# Patient Record
Sex: Female | Born: 1975 | Race: White | Hispanic: No | Marital: Married | Smoking: Former smoker
Health system: Southern US, Community
[De-identification: ages and names within clinical notes are randomized; demographics above are authoritative.]

## PROBLEM LIST (undated history)

## (undated) DIAGNOSIS — E079 Disorder of thyroid, unspecified: Secondary | ICD-10-CM

## (undated) DIAGNOSIS — E119 Type 2 diabetes mellitus without complications: Secondary | ICD-10-CM

## (undated) HISTORY — DX: Disorder of thyroid, unspecified: E07.9

## (undated) HISTORY — DX: Type 2 diabetes mellitus without complications: E11.9

---

## 1996-06-17 HISTORY — PX: TUBAL LIGATION: SHX77

## 2004-07-18 ENCOUNTER — Ambulatory Visit: Payer: Self-pay | Admitting: Family Medicine

## 2004-07-20 ENCOUNTER — Ambulatory Visit: Payer: Self-pay | Admitting: Family Medicine

## 2004-07-24 ENCOUNTER — Ambulatory Visit: Payer: Self-pay | Admitting: Family Medicine

## 2005-01-01 ENCOUNTER — Ambulatory Visit: Payer: Self-pay | Admitting: Family Medicine

## 2005-05-28 ENCOUNTER — Ambulatory Visit: Payer: Self-pay | Admitting: Family Medicine

## 2005-09-19 ENCOUNTER — Ambulatory Visit: Payer: Self-pay | Admitting: Family Medicine

## 2005-10-21 ENCOUNTER — Ambulatory Visit: Payer: Self-pay | Admitting: Family Medicine

## 2005-11-29 ENCOUNTER — Ambulatory Visit: Payer: Self-pay | Admitting: Family Medicine

## 2012-12-15 ENCOUNTER — Telehealth (HOSPITAL_COMMUNITY): Payer: Self-pay | Admitting: Dietician

## 2012-12-15 NOTE — Telephone Encounter (Signed)
Called number provided ([phone]) at [phone] and [phone]. Both attempts yielded a message that number is out of service. Will close out.

## 2012-12-15 NOTE — Telephone Encounter (Signed)
Received voicemail from [place] on 12/14/12 at 1443. She is requesting a call back and nutrition and diabetes management.

## 2017-03-10 ENCOUNTER — Encounter: Payer: Self-pay | Admitting: Family Medicine

## 2017-03-10 ENCOUNTER — Ambulatory Visit (INDEPENDENT_AMBULATORY_CARE_PROVIDER_SITE_OTHER): Payer: Self-pay | Admitting: Family Medicine

## 2017-03-10 VITALS — BP 115/76 | HR 67 | Temp 98.1°F | Wt 138.0 lb

## 2017-03-10 DIAGNOSIS — M62838 Other muscle spasm: Secondary | ICD-10-CM

## 2017-03-10 DIAGNOSIS — Z7689 Persons encountering health services in other specified circumstances: Secondary | ICD-10-CM

## 2017-03-10 DIAGNOSIS — E041 Nontoxic single thyroid nodule: Secondary | ICD-10-CM | POA: Insufficient documentation

## 2017-03-10 DIAGNOSIS — M545 Low back pain, unspecified: Secondary | ICD-10-CM

## 2017-03-10 DIAGNOSIS — E039 Hypothyroidism, unspecified: Secondary | ICD-10-CM | POA: Insufficient documentation

## 2017-03-10 DIAGNOSIS — Z8739 Personal history of other diseases of the musculoskeletal system and connective tissue: Secondary | ICD-10-CM

## 2017-03-10 MED ORDER — IBUPROFEN 800 MG PO TABS: 800.0000 mg | ORAL_TABLET | Freq: Three times a day (TID) | ORAL | 0 refills | Status: DC | PRN

## 2017-03-10 MED ORDER — TIZANIDINE HCL 4 MG PO CAPS: 4.0000 mg | ORAL_CAPSULE | Freq: Three times a day (TID) | ORAL | 0 refills | Status: DC | PRN

## 2017-03-10 NOTE — Assessment & Plan Note (Signed)
Per patient report, she was seen at [facility] in [place]. She had an injection to her back performed at that time. She notes that the pain she is having in her low back is similar to that time. She would like a referral for reevaluation and possible injection. Of note, she does have pain with bilateral straight leg raise.
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
## 2017-03-10 NOTE — Assessment & Plan Note (Signed)
Check TSH today. Additionally, there was an incidental thyroid nodule found on CAT scan at [facility] per patient's report. Unfortunately, I am unable to see this result. However given her known hypothyroidism, will refer to endocrinology for further evaluation and management.

## 2017-03-10 NOTE — Assessment & Plan Note (Signed)
See above. Reasons for emergent evaluation in the emergency department were reviewed with patient. See AVS.

## 2017-03-11 LAB — TSH: TSH: 1.85 u[IU]/mL (ref 0.450–4.500)

## 2017-03-13 ENCOUNTER — Telehealth: Payer: Self-pay | Admitting: Family Medicine
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
## 2017-03-13 NOTE — Telephone Encounter (Signed)
Pt informed to try OTC ibuprofen and Salonpas patches Verbalizes understanding

## 2017-03-27 ENCOUNTER — Ambulatory Visit (INDEPENDENT_AMBULATORY_CARE_PROVIDER_SITE_OTHER): Payer: Self-pay | Admitting: Orthopaedic Surgery

## 2017-04-09 ENCOUNTER — Encounter: Payer: Self-pay | Admitting: "Endocrinology

## 2017-04-09 ENCOUNTER — Ambulatory Visit (INDEPENDENT_AMBULATORY_CARE_PROVIDER_SITE_OTHER): Payer: Self-pay | Admitting: "Endocrinology

## 2017-04-09 VITALS — BP 124/84 | HR 76 | Ht 62.0 in | Wt 141.0 lb

## 2017-04-09 DIAGNOSIS — E041 Nontoxic single thyroid nodule: Secondary | ICD-10-CM

## 2017-04-09 DIAGNOSIS — E039 Hypothyroidism, unspecified: Secondary | ICD-10-CM
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
## 2017-04-10 ENCOUNTER — Other Ambulatory Visit: Payer: Self-pay

## 2017-04-10 ENCOUNTER — Encounter (INDEPENDENT_AMBULATORY_CARE_PROVIDER_SITE_OTHER): Payer: Self-pay | Admitting: Orthopaedic Surgery

## 2017-04-10 ENCOUNTER — Ambulatory Visit (INDEPENDENT_AMBULATORY_CARE_PROVIDER_SITE_OTHER): Payer: Self-pay | Admitting: Orthopaedic Surgery

## 2017-04-10 ENCOUNTER — Ambulatory Visit (INDEPENDENT_AMBULATORY_CARE_PROVIDER_SITE_OTHER): Payer: Self-pay

## 2017-04-10 ENCOUNTER — Other Ambulatory Visit: Payer: Self-pay | Admitting: "Endocrinology

## 2017-04-10 VITALS — BP 134/77 | HR 70 | Ht 63.0 in | Wt 140.0 lb

## 2017-04-10 DIAGNOSIS — M545 Low back pain: Secondary | ICD-10-CM

## 2017-04-10 DIAGNOSIS — E041 Nontoxic single thyroid nodule: Secondary | ICD-10-CM

## 2017-04-10 DIAGNOSIS — E039 Hypothyroidism, unspecified: Secondary | ICD-10-CM
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
## 2017-04-11 LAB — T3, FREE: T3, Free: 2.9 pg/mL (ref 2.0–4.4)

## 2017-04-11 LAB — SPECIMEN STATUS REPORT

## 2017-04-25 ENCOUNTER — Ambulatory Visit (HOSPITAL_COMMUNITY)
Admission: RE | Admit: 2017-04-25 | Discharge: 2017-04-25 | Disposition: A | Discharge status: Home / Self Care | Payer: Self-pay | Source: Ambulatory Visit | Attending: "Endocrinology | Admitting: "Endocrinology

## 2017-04-25 DIAGNOSIS — E041 Nontoxic single thyroid nodule: Secondary | ICD-10-CM | POA: Insufficient documentation

## 2017-04-28 ENCOUNTER — Ambulatory Visit: Payer: Self-pay | Admitting: "Endocrinology

## 2017-04-29 ENCOUNTER — Other Ambulatory Visit: Payer: Self-pay

## 2017-04-30 LAB — T4, FREE: Free T4: 1.31 ng/dL (ref 0.82–1.77)

## 2017-04-30 LAB — THYROGLOBULIN ANTIBODY: Thyroglobulin Antibody: <1 IU/mL (ref 0.0–0.9)

## 2017-04-30 LAB — TSH: TSH: 2.08 u[IU]/mL (ref 0.450–4.500)

## 2017-04-30 LAB — THYROID PEROXIDASE ANTIBODY: Thyroperoxidase Ab SerPl-aCnc: 11 IU/mL (ref 0–34)

## 2017-05-01 ENCOUNTER — Telehealth (INDEPENDENT_AMBULATORY_CARE_PROVIDER_SITE_OTHER): Payer: Self-pay | Admitting: Orthopaedic Surgery

## 2017-05-01 NOTE — Telephone Encounter (Signed)
Cathlean SauerKarylis from the law office of [employer] called stating that the PT facility we referred the patient to does not work on a lien basis and would like to get [facility] [place].  CB# [phone].  Thank you.

## 2017-05-01 NOTE — Telephone Encounter (Signed)
Can you tell me if we have authorization to speak to this law firm? I did not see it in the chart. Thanks.

## 2017-05-01 NOTE — Telephone Encounter (Signed)
Ok thanks 

## 2017-05-01 NOTE — Telephone Encounter (Signed)
Ok for change in location?

## 2017-05-07 ENCOUNTER — Encounter: Payer: Self-pay | Admitting: "Endocrinology

## 2017-05-07 ENCOUNTER — Ambulatory Visit (INDEPENDENT_AMBULATORY_CARE_PROVIDER_SITE_OTHER): Payer: Self-pay | Admitting: "Endocrinology

## 2017-05-07 VITALS — BP 128/86 | HR 83 | Ht 63.0 in | Wt 142.0 lb

## 2017-05-07 DIAGNOSIS — E041 Nontoxic single thyroid nodule: Secondary | ICD-10-CM

## 2017-05-07 DIAGNOSIS — E039 Hypothyroidism, unspecified: Secondary | ICD-10-CM

## 2017-05-07 MED ORDER — LEVOTHYROXINE SODIUM 25 MCG PO TABS
25.0000 ug | ORAL_TABLET | Freq: Every day | ORAL | 6 refills | Status: DC
Start: 2017-05-07 — End: 2017-11-04
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
## 2017-05-13 NOTE — Telephone Encounter (Signed)
I don't have anything

## 2017-05-15 ENCOUNTER — Ambulatory Visit (INDEPENDENT_AMBULATORY_CARE_PROVIDER_SITE_OTHER): Payer: Self-pay | Admitting: Orthopaedic Surgery

## 2017-05-15 ENCOUNTER — Telehealth (INDEPENDENT_AMBULATORY_CARE_PROVIDER_SITE_OTHER): Payer: Self-pay | Admitting: Radiology
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
## 2017-05-15 NOTE — Telephone Encounter (Signed)
I left voicemail. We do not have authorization to speak with law firm.

## 2017-05-15 NOTE — Telephone Encounter (Signed)
She just signs a release at their office and we can send them .

## 2017-05-16 NOTE — Telephone Encounter (Signed)
Patient is going to pick up CD of x-rays done here when she comes to sign release today. Can you please burn lumbar spine x-rays to disc? Thanks.

## 2017-05-16 NOTE — Telephone Encounter (Signed)
I called patient and advised. She is going to come by here and sign medical release for us to send office notes to [facility].

## 2017-06-04 ENCOUNTER — Ambulatory Visit (INDEPENDENT_AMBULATORY_CARE_PROVIDER_SITE_OTHER): Payer: Self-pay | Admitting: *Deleted

## 2017-06-04 DIAGNOSIS — Z23 Encounter for immunization: Secondary | ICD-10-CM

## 2017-10-28 ENCOUNTER — Other Ambulatory Visit: Payer: Self-pay

## 2017-10-28 ENCOUNTER — Other Ambulatory Visit: Payer: Self-pay | Admitting: "Endocrinology

## 2017-10-28 DIAGNOSIS — E1165 Type 2 diabetes mellitus with hyperglycemia: Secondary | ICD-10-CM

## 2017-10-28 DIAGNOSIS — E559 Vitamin D deficiency, unspecified: Secondary | ICD-10-CM

## 2017-10-28 DIAGNOSIS — E039 Hypothyroidism, unspecified: Secondary | ICD-10-CM

## 2017-10-29 LAB — COMPREHENSIVE METABOLIC PANEL
ALT: 19 IU/L (ref 0–32)
AST: 18 IU/L (ref 0–40)
Albumin/Globulin Ratio: 1.9 (ref 1.2–2.2)
Albumin: 4.1 g/dL (ref 3.5–5.5)
Alkaline Phosphatase: 63 IU/L (ref 39–117)
BUN/Creatinine Ratio: 22 (ref 9–23)
BUN: 17 mg/dL (ref 6–24)
Bilirubin Total: 0.3 mg/dL (ref 0.0–1.2)
CO2: 22 mmol/L (ref 20–29)
Calcium: 8.9 mg/dL (ref 8.7–10.2)
Chloride: 102 mmol/L (ref 96–106)
Creatinine, Ser: 0.78 mg/dL (ref 0.57–1.00)
GFR calc Af Amer: 108 mL/min/{1.73_m2} (ref >59)
GFR calc non Af Amer: 93 mL/min/{1.73_m2} (ref >59)
Globulin, Total: 2.2 g/dL (ref 1.5–4.5)
Glucose: 82 mg/dL (ref 65–99)
Potassium: 4.2 mmol/L (ref 3.5–5.2)
Sodium: 138 mmol/L (ref 134–144)
Total Protein: 6.3 g/dL (ref 6.0–8.5)

## 2017-10-29 LAB — TSH: TSH: 2.93 u[IU]/mL (ref 0.450–4.500)

## 2017-10-29 LAB — HEMOGLOBIN A1C
Est. average glucose Bld gHb Est-mCnc: 100 mg/dL
Hgb A1c MFr Bld: 5.1 % (ref 4.8–5.6)

## 2017-10-29 LAB — T4, FREE: Free T4: 1.23 ng/dL (ref 0.82–1.77)

## 2017-10-29 LAB — VITAMIN D 25 HYDROXY (VIT D DEFICIENCY, FRACTURES): Vit D, 25-Hydroxy: 37.2 ng/mL (ref 30.0–100.0)

## 2017-11-04 ENCOUNTER — Encounter: Payer: Self-pay | Admitting: "Endocrinology

## 2017-11-04 ENCOUNTER — Ambulatory Visit (INDEPENDENT_AMBULATORY_CARE_PROVIDER_SITE_OTHER): Payer: Self-pay | Admitting: "Endocrinology

## 2017-11-04 VITALS — BP 135/88 | HR 76 | Ht 63.0 in | Wt 146.0 lb

## 2017-11-04 DIAGNOSIS — E039 Hypothyroidism, unspecified: Secondary | ICD-10-CM

## 2017-11-04 DIAGNOSIS — E041 Nontoxic single thyroid nodule: Secondary | ICD-10-CM

## 2017-11-04 MED ORDER — LEVOTHYROXINE SODIUM 25 MCG PO TABS: 37.5000 ug | ORAL_TABLET | Freq: Every day | ORAL | 6 refills | Status: DC
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
## 2017-11-11 ENCOUNTER — Ambulatory Visit (HOSPITAL_COMMUNITY): Payer: Self-pay

## 2018-04-20 ENCOUNTER — Other Ambulatory Visit: Payer: Self-pay | Admitting: "Endocrinology

## 2018-04-20 DIAGNOSIS — E039 Hypothyroidism, unspecified: Secondary | ICD-10-CM

## 2018-05-04 ENCOUNTER — Ambulatory Visit (HOSPITAL_COMMUNITY)
Admission: RE | Admit: 2018-05-04 | Discharge: 2018-05-04 | Disposition: A | Discharge status: Home / Self Care | Payer: Self-pay | Source: Ambulatory Visit | Attending: "Endocrinology | Admitting: "Endocrinology

## 2018-05-04 ENCOUNTER — Other Ambulatory Visit: Payer: Self-pay

## 2018-05-04 DIAGNOSIS — E041 Nontoxic single thyroid nodule: Secondary | ICD-10-CM | POA: Insufficient documentation

## 2018-05-05 ENCOUNTER — Ambulatory Visit (HOSPITAL_COMMUNITY): Payer: Self-pay

## 2018-05-05 LAB — T4, FREE: Free T4: 1.23 ng/dL (ref 0.82–1.77)

## 2018-05-05 LAB — TSH: TSH: 2.29 u[IU]/mL (ref 0.450–4.500)

## 2018-05-07 ENCOUNTER — Other Ambulatory Visit: Payer: Self-pay | Admitting: "Endocrinology

## 2018-05-11 ENCOUNTER — Encounter: Payer: Self-pay | Admitting: "Endocrinology

## 2018-05-11 ENCOUNTER — Ambulatory Visit (INDEPENDENT_AMBULATORY_CARE_PROVIDER_SITE_OTHER): Payer: Self-pay | Admitting: "Endocrinology

## 2018-05-11 VITALS — BP 118/77 | HR 65 | Ht 63.0 in | Wt 146.0 lb

## 2018-05-11 DIAGNOSIS — E039 Hypothyroidism, unspecified: Secondary | ICD-10-CM

## 2018-05-11 DIAGNOSIS — E041 Nontoxic single thyroid nodule: Secondary | ICD-10-CM

## 2018-05-11 MED ORDER — LEVOTHYROXINE SODIUM 25 MCG PO TABS: 37.5000 ug | ORAL_TABLET | Freq: Every day | ORAL | 2 refills | Status: DC

## 2018-05-11 NOTE — Progress Notes (Signed)
Endocrinology follow-up note   Subjective:    Patient ID: Lori Koch, female    DOB: [birth year], PCP Raliegh Ip, DO   Past Medical History:  Diagnosis Date  . Diabetes mellitus without complication (HCC) 2019   diet only  . Thyroid disease    hypothyroidism   Past Surgical History:  Procedure Laterality Date  . TUBAL LIGATION  1998   Social History   Socioeconomic History  . Marital status: Married    Spouse name: Not on file  . Number of children: Not on file  . Years of education: Not on file  . Highest education level: Not on file  Occupational History  . Not on file  Social Needs  . Financial resource strain: Not on file  . Food insecurity:    Worry: Not on file    Inability: Not on file  . Transportation needs:    Medical: Not on file    Non-medical: Not on file  Tobacco Use  . Smoking status: Former Smoker    Packs/day: 0.50    Years: 10.00    Pack years: 5.00    Types: Cigarettes    Last attempt to quit: 1995    Years since quitting: 24.9  . Smokeless tobacco: Never Used  Substance and Sexual Activity  . Alcohol use: No  . Drug use: No  . Sexual activity: Yes    Birth control/protection: None  Lifestyle  . Physical activity:    Days per week: Not on file    Minutes per session: Not on file  . Stress: Not on file  Relationships  . Social connections:    Talks on phone: Not on file    Gets together: Not on file    Attends religious service: Not on file    Active member of club or organization: Not on file    Attends meetings of clubs or organizations: Not on file    Relationship status: Not on file  Other Topics Concern  . Not on file  Social History Narrative  . Not on file   Outpatient Encounter Medications as of 05/11/2018  Medication Sig  . ibuprofen (ADVIL,MOTRIN) 800 MG tablet Take 1 tablet (800 mg total) by mouth every 8 (eight) hours as needed. (Patient not taking:  Reported on 04/10/2017)  . levothyroxine (SYNTHROID, LEVOTHROID) 25 MCG tablet Take 1.5 tablets (37.5 mcg total) by mouth daily before breakfast.  . [DISCONTINUED] levothyroxine (SYNTHROID, LEVOTHROID) 25 MCG tablet TAKE 1.5 TABLETS (37.5 MCG TOTAL) BY MOUTH DAILY BEFORE BREAKFAST.   No facility-administered encounter medications on file as of 05/11/2018.    ALLERGIES: Allergies  Allergen Reactions  . Sulfa Antibiotics Hives    VACCINATION STATUS: Immunization History  Administered Date(s) Administered  . Influenza,inj,Quad PF,6+ Mos 06/04/2017    HPI Lori Koch is 42 y.o. female who presents today with a medical history as above. she is returning for follow-up of hypothyroidism with new set of thyroid function tests.  She is also known to have nodular goiter.    -She has no new complaints today.   - She says she was diagnosed with hypothyroidism in 2011 when she was initiated on levothyroxine currently on 37.5 g by mouth every morning.   -Her previsit thyroid ultrasound shows overall decrease in size of bilateral thyroid lobes and decrease in size of thyroid nodule in  the right lobe to 1.5 cm from 1.7 cm.  This nodule had no suspicious features.  -She has a steady weight.  Has no new complaints.   She has diet controlled type 2 diabetes with a reported A1c of 4.5%. -She denies palpitations, hot/cold intolerance. She denies dysphagia, shortness of breath, nor voice change. -She denies exposure to neck radiation. - She is a former smoker. - She has 3 grown kids status post tubal ligation in 1998.  Review of Systems  Constitutional: + Steady weight ,  no fatigue, no subjective hyperthermia, no subjective hypothermia Eyes: no blurry vision, no xerophthalmia ENT: no sore throat, no nodules palpated in throat, no dysphagia/odynophagia, no hoarseness Cardiovascular: no chest pain, no palpitations.   Musculoskeletal: no muscle/joint aches Skin: no rashes Neurological: No  tremors, no tingling.  Psychiatric: no depression, no anxiety  Objective:    BP 118/77   Pulse 65   Ht 5\' 3"  (1.6 m)   Wt 146 lb (66.2 kg)   BMI 25.86 kg/m   Wt Readings from Last 3 Encounters:  05/11/18 146 lb (66.2 kg)  11/04/17 146 lb (66.2 kg)  05/07/17 142 lb (64.4 kg)    Physical Exam  Constitutional: not in acute distress, normal state of mind Eyes: PERRLA, EOMI, no exophthalmos ENT: moist mucous membranes, no thyromegaly, no cervical lymphadenopathy  Musculoskeletal: no gross deformities, strength intact in all four extremities Skin: moist, warm, no rashes Neurological: no tremor with outstretched hands, Deep tendon reflexes normal in all four extremities.    TSH from 03/10/2017 was 1.85 Recent Results (from the past 2160 hour(s))  TSH     Status: None   Collection Time: 05/04/18 10:28 AM  Result Value Ref Range   TSH 2.290 0.450 - 4.500 uIU/mL  T4, Free     Status: None   Collection Time: 05/04/18 10:28 AM  Result Value Ref Range   Free T4 1.23 0.82 - 1.77 ng/dL   Thyroid ultrasound from 04/25/2017 showed right lobe 4.6 cm with 1.5 cm nodule; left lobe 3.3 cm with no nodules.   Thyroid ultrasound on May 04, 2018: Right lobe decrease in size to 3.6 cm from four-point recliner, left lobe decreased to 3.1 cm from 3.3 cm.  1.5 cm nodule on the right lobe, previously 1.7 cm.  Assessment & Plan:   1. Hypothyroidism 2. Thyroid nodule - Based on her thyroid function tests, she will benefit from slight increase in her levothyroxine.  -She is advised to continue  levothyroxine to 37.5 mcg p.o. daily before breakfast.   - We discussed about correct intake of levothyroxine, at fasting, with water, separated by at least 30 minutes from breakfast, and separated by more than 4 hours from calcium, iron, multivitamins, acid reflux medications (PPIs). -Patient is made aware of the fact that thyroid hormone replacement is needed for life, dose to be adjusted by periodic  monitoring of thyroid function tests.   - Regarding 1.5 cm nodule on the right lobe with no suspicious features, she will not need immediate intervention. She may need surveillance ultrasound in 1-2 years.     - I advised patient to maintain close follow up with Raliegh IpGottschalk, Ashly M, DO for primary care needs. Follow up plan: Return in about 6 months (around 11/09/2018) for Follow up with Pre-visit Labs.  Marquis LunchGebre Brinden Kincheloe, MD [facility] Phone: [phone]  Fax: [phone]   05/11/2018, 3:34 PM This note was partially dictated with voice recognition software. Similar sounding words  can be transcribed inadequately or may not  be corrected upon review.

## 2018-11-02 ENCOUNTER — Other Ambulatory Visit: Payer: Self-pay | Admitting: "Endocrinology

## 2018-11-02 DIAGNOSIS — E039 Hypothyroidism, unspecified: Secondary | ICD-10-CM

## 2018-11-03 ENCOUNTER — Other Ambulatory Visit: Payer: Self-pay

## 2018-11-04 LAB — TSH: TSH: 2.48 u[IU]/mL (ref 0.450–4.500)

## 2018-11-04 LAB — T4, FREE: Free T4: 1.32 ng/dL (ref 0.82–1.77)

## 2018-11-10 ENCOUNTER — Encounter: Payer: Self-pay | Admitting: "Endocrinology

## 2018-11-10 ENCOUNTER — Ambulatory Visit (INDEPENDENT_AMBULATORY_CARE_PROVIDER_SITE_OTHER): Payer: Self-pay | Admitting: "Endocrinology

## 2018-11-10 ENCOUNTER — Other Ambulatory Visit: Payer: Self-pay

## 2018-11-10 VITALS — BP 113/71 | HR 81 | Ht 63.0 in | Wt 146.0 lb

## 2018-11-10 DIAGNOSIS — E039 Hypothyroidism, unspecified: Secondary | ICD-10-CM
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
## 2018-11-17 ENCOUNTER — Encounter: Payer: Self-pay | Admitting: Family Medicine

## 2018-11-17 ENCOUNTER — Ambulatory Visit (INDEPENDENT_AMBULATORY_CARE_PROVIDER_SITE_OTHER): Payer: Self-pay | Admitting: Family Medicine

## 2018-11-17 ENCOUNTER — Other Ambulatory Visit: Payer: Self-pay

## 2018-11-17 DIAGNOSIS — J0100 Acute maxillary sinusitis, unspecified: Secondary | ICD-10-CM

## 2018-11-17 MED ORDER — PSEUDOEPHEDRINE-GUAIFENESIN ER 60-600 MG PO TB12: 1.0000 | ORAL_TABLET | Freq: Two times a day (BID) | ORAL | 0 refills | Status: DC

## 2018-11-17 MED ORDER — AMOXICILLIN-POT CLAVULANATE 875-125 MG PO TABS: 1.0000 | ORAL_TABLET | Freq: Two times a day (BID) | ORAL | 0 refills | Status: DC

## 2018-11-17 NOTE — Progress Notes (Signed)
Subjective:    Patient ID: Lori ChangLori C Candela, female    DOB: 1976-01-07, 43 y.o.   MRN: [id number]   HPI: Lori Koch is a 43 y.o. female presenting for ears stopped up and constant thick posterior sinus drainage. Using claritin daily. Throat is scratchy. Allergic to pollen of all types. No fever, chills, sweats .    [facility] 05/07/2017 03/10/2017  Decreased Interest 0 0  Down, Depressed, Hopeless 0 0  PHQ - 2 Score 0 0     Relevant past medical, surgical, family and social history reviewed and updated as indicated.  Interim medical history since our last visit reviewed. Allergies and medications reviewed and updated.  ROS:  Review of Systems  Constitutional: Negative for activity change, appetite change, chills and fever.  HENT: Positive for congestion, postnasal drip, rhinorrhea, sinus pressure and sneezing. Negative for ear discharge, hearing loss, nosebleeds and trouble swallowing.   Respiratory: Negative for chest tightness and shortness of breath.   Cardiovascular: Negative for chest pain and palpitations.  Skin: Negative for rash.     Social History   Tobacco Use  Smoking Status Former Smoker  . Packs/day: 0.50  . Years: 10.00  . Pack years: 5.00  . Types: Cigarettes  . Last attempt to quit: 1995  . Years since quitting: 25.4  Smokeless Tobacco Never Used       Objective:     Wt Readings from Last 3 Encounters:  11/10/18 146 lb (66.2 kg)  05/11/18 146 lb (66.2 kg)  11/04/17 146 lb (66.2 kg)     Exam deferred. Pt. Harboring due to COVID 19. Phone visit performed.   Assessment & Plan:   1. Acute maxillary sinusitis, recurrence not specified     Meds ordered this encounter  Medications  . amoxicillin-clavulanate (AUGMENTIN) 875-125 MG tablet    Sig: Take 1 tablet by mouth 2 (two) times daily. Take all of this medication    Dispense:  20 tablet    Refill:  0  . pseudoephedrine-guaifenesin (MUCINEX D) 60-600 MG 12 hr tablet   Sig: Take 1 tablet by mouth every 12 (twelve) hours. As needed for congestion    Dispense:  20 tablet    Refill:  0    No orders of the defined types were placed in this encounter.     Diagnoses and all orders for this visit:  Acute maxillary sinusitis, recurrence not specified  Other orders -     amoxicillin-clavulanate (AUGMENTIN) 875-125 MG tablet; Take 1 tablet by mouth 2 (two) times daily. Take all of this medication -     pseudoephedrine-guaifenesin (MUCINEX D) 60-600 MG 12 hr tablet; Take 1 tablet by mouth every 12 (twelve) hours. As needed for congestion    Virtual Visit via telephone Note  I discussed the limitations, risks, security and privacy concerns of performing an evaluation and management service by telephone and the availability of in person appointments. The patient was identified with two identifiers. Pt.expressed understanding and agreed to proceed. Pt. Is at home. Dr. Darlyn ReadStacks is in his office.  Follow Up Instructions:   I discussed the assessment and treatment plan with the patient. The patient was provided an opportunity to ask questions and all were answered. The patient agreed with the plan and demonstrated an understanding of the instructions.   The patient was advised to call back or seek an in-person evaluation if the symptoms worsen or if the condition fails to improve as anticipated.   Total  minutes including chart review and phone contact time: 10   Follow up plan: Return if symptoms worsen or fail to improve.  Mechele Claude, MD [place] [facility]

## 2019-01-19 ENCOUNTER — Other Ambulatory Visit: Payer: Self-pay

## 2019-01-20 ENCOUNTER — Encounter: Payer: Self-pay | Admitting: Physician Assistant

## 2019-01-20 ENCOUNTER — Ambulatory Visit (INDEPENDENT_AMBULATORY_CARE_PROVIDER_SITE_OTHER): Payer: Self-pay | Admitting: Physician Assistant

## 2019-01-20 VITALS — BP 111/77 | HR 79 | Temp 98.2°F | Ht 63.0 in | Wt 146.6 lb

## 2019-01-20 DIAGNOSIS — Z8781 Personal history of (healed) traumatic fracture: Secondary | ICD-10-CM

## 2019-01-20 DIAGNOSIS — M79672 Pain in left foot: Secondary | ICD-10-CM

## 2019-01-20 NOTE — Patient Instructions (Signed)
Sole inserts REI at [employer]

## 2019-01-21 ENCOUNTER — Other Ambulatory Visit: Payer: Self-pay

## 2019-01-21 ENCOUNTER — Ambulatory Visit (INDEPENDENT_AMBULATORY_CARE_PROVIDER_SITE_OTHER): Payer: Self-pay

## 2019-01-21 DIAGNOSIS — Z8781 Personal history of (healed) traumatic fracture: Secondary | ICD-10-CM

## 2019-01-21 DIAGNOSIS — M79672 Pain in left foot: Secondary | ICD-10-CM

## 2019-01-24 NOTE — Progress Notes (Signed)
BP 111/77   Pulse 79   Temp 98.2 F (36.8 C) (Temporal)   Ht 5\' 3"  (1.6 m)   Wt 146 lb 9.6 oz (66.5 kg)   LMP 01/06/2019   BMI 25.97 kg/m    Subjective:    Patient ID: Lori Koch, female    DOB: July 30, 1975, 43 y.o.   MRN: [id number]  HPI: Lori Koch is a 43 y.o. female presenting on 01/20/2019 for Foot Pain (left)  Over the past couple weeks the patient has a great increase in her pain.  She is working 16-hour shifts.  She did have a stress fracture sometime ago.  And did wear a boot.  She states it feels like it did whenever that was going on.  We will have her come back in for x-ray on a future day.  We did not have that in house today.  We have updated her medications and will plan for her to try to rest it when at all possible.  Past Medical History:  Diagnosis Date  . Diabetes mellitus without complication (HCC) 2019   diet only  . Thyroid disease    hypothyroidism   Relevant past medical, surgical, family and social history reviewed and updated as indicated. Interim medical history since our last visit reviewed. Allergies and medications reviewed and updated. DATA REVIEWED: CHART IN EPIC  Family History reviewed for pertinent findings.  Review of Systems  Constitutional: Negative.   HENT: Negative.   Eyes: Negative.   Respiratory: Negative.   Gastrointestinal: Negative.   Genitourinary: Negative.   Musculoskeletal: Positive for arthralgias, joint swelling and myalgias.    Allergies as of 01/20/2019      Reactions   Sulfa Antibiotics Hives      Medication List       Accurate as of January 20, 2019 11:59 PM. If you have any questions, ask your nurse or doctor.        STOP taking these medications   amoxicillin-clavulanate 875-125 MG tablet Commonly known as: AUGMENTIN Stopped by: Remus LofflerAngel S Danese Dorsainvil, PA-C   pseudoephedrine-guaifenesin 60-600 MG 12 hr tablet Commonly known as: Mucinex D Stopped by: Remus LofflerAngel S Jniya Madara, PA-C     TAKE these medications    ibuprofen 800 MG tablet Commonly known as: ADVIL Take 1 tablet (800 mg total) by mouth every 8 (eight) hours as needed.   levothyroxine 25 MCG tablet Commonly known as: SYNTHROID Take 1.5 tablets (37.5 mcg total) by mouth daily before breakfast.          Objective:    BP 111/77   Pulse 79   Temp 98.2 F (36.8 C) (Temporal)   Ht 5\' 3"  (1.6 m)   Wt 146 lb 9.6 oz (66.5 kg)   LMP 01/06/2019   BMI 25.97 kg/m   Allergies  Allergen Reactions  . Sulfa Antibiotics Hives    Wt Readings from Last 3 Encounters:  01/20/19 146 lb 9.6 oz (66.5 kg)  11/10/18 146 lb (66.2 kg)  05/11/18 146 lb (66.2 kg)    Physical Exam Constitutional:      General: She is not in acute distress.    Appearance: Normal appearance. She is well-developed.  HENT:     Head: Normocephalic and atraumatic.  Cardiovascular:     Rate and Rhythm: Normal rate.  Pulmonary:     Effort: Pulmonary effort is normal.  Musculoskeletal:     Left foot: Tenderness present. No deformity or laceration.       Feet:  Skin:  General: Skin is warm and dry.     Findings: No rash.  Neurological:     Mental Status: She is alert and oriented to person, place, and time.     Deep Tendon Reflexes: Reflexes are normal and symmetric.         Assessment & Plan:   1. Foot pain, left - DG Foot Complete Left; Future  2. History of foot fracture - DG Foot Complete Left; Future   Continue all other maintenance medications as listed above.  Follow up plan: No follow-ups on file.  Educational handout given for [place] PA-C [place] [address]  [place], [place] [address] [phone]   01/24/2019, 10:19 PM

## 2019-03-17 ENCOUNTER — Other Ambulatory Visit: Payer: Self-pay | Admitting: "Endocrinology

## 2019-03-17 DIAGNOSIS — E041 Nontoxic single thyroid nodule: Secondary | ICD-10-CM

## 2019-03-22 ENCOUNTER — Ambulatory Visit (INDEPENDENT_AMBULATORY_CARE_PROVIDER_SITE_OTHER): Payer: Self-pay

## 2019-03-22 ENCOUNTER — Other Ambulatory Visit: Payer: Self-pay

## 2019-03-22 DIAGNOSIS — Z111 Encounter for screening for respiratory tuberculosis: Secondary | ICD-10-CM

## 2019-03-22 NOTE — Progress Notes (Signed)
TB skin test to right arm.  Patient tolerated well.

## 2019-03-24 LAB — TB SKIN TEST
Induration: 0 mm
TB Skin Test: NEGATIVE

## 2019-04-16 ENCOUNTER — Other Ambulatory Visit: Payer: Self-pay

## 2019-04-19 ENCOUNTER — Ambulatory Visit (INDEPENDENT_AMBULATORY_CARE_PROVIDER_SITE_OTHER): Payer: Self-pay | Admitting: *Deleted

## 2019-04-19 ENCOUNTER — Other Ambulatory Visit: Payer: Self-pay

## 2019-04-19 DIAGNOSIS — Z23 Encounter for immunization: Secondary | ICD-10-CM

## 2019-04-26 ENCOUNTER — Other Ambulatory Visit: Payer: Self-pay | Admitting: "Endocrinology

## 2019-04-26 DIAGNOSIS — E039 Hypothyroidism, unspecified: Secondary | ICD-10-CM

## 2019-05-11 ENCOUNTER — Other Ambulatory Visit: Payer: Self-pay

## 2019-05-12 LAB — TSH: TSH: 1.54 u[IU]/mL (ref 0.450–4.500)

## 2019-05-12 LAB — T4, FREE: Free T4: 1.32 ng/dL (ref 0.82–1.77)

## 2019-05-17 ENCOUNTER — Ambulatory Visit: Payer: Self-pay | Admitting: "Endocrinology

## 2019-05-17 ENCOUNTER — Ambulatory Visit (HOSPITAL_COMMUNITY)
Admission: RE | Admit: 2019-05-17 | Discharge: 2019-05-17 | Disposition: A | Discharge status: Home / Self Care | Payer: Self-pay | Source: Ambulatory Visit | Attending: "Endocrinology | Admitting: "Endocrinology

## 2019-05-17 ENCOUNTER — Other Ambulatory Visit: Payer: Self-pay

## 2019-05-17 DIAGNOSIS — E041 Nontoxic single thyroid nodule: Secondary | ICD-10-CM | POA: Insufficient documentation

## 2019-05-20 ENCOUNTER — Other Ambulatory Visit: Payer: Self-pay

## 2019-05-20 ENCOUNTER — Encounter: Payer: Self-pay | Admitting: "Endocrinology

## 2019-05-20 ENCOUNTER — Ambulatory Visit (INDEPENDENT_AMBULATORY_CARE_PROVIDER_SITE_OTHER): Payer: Self-pay | Admitting: "Endocrinology

## 2019-05-20 DIAGNOSIS — E039 Hypothyroidism, unspecified: Secondary | ICD-10-CM

## 2019-05-20 DIAGNOSIS — E041 Nontoxic single thyroid nodule: Secondary | ICD-10-CM

## 2019-05-20 MED ORDER — LEVOTHYROXINE SODIUM 25 MCG PO TABS: 37.5000 ug | ORAL_TABLET | Freq: Every day | ORAL | 1 refills | Status: DC

## 2019-05-20 NOTE — Progress Notes (Signed)
05/20/2019                                Endocrinology Telehealth Visit Follow up Note -During COVID -19 Pandemic  I connected with Lori Koch on 05/20/2019   by telephone and verified that I am speaking with the correct person using two identifiers. Lori Koch, 1976/04/13. she has verbally consented to this visit. All issues noted in this document were discussed and addressed. The format was not optimal for physical exam.   Subjective:    Patient ID: Lori Koch, female    DOB: [birth year], PCP Janora Norlander, DO   Past Medical History:  Diagnosis Date  . Diabetes mellitus without complication ([place]) 5631   diet only  . Thyroid disease    hypothyroidism   Past Surgical History:  Procedure Laterality Date  . TUBAL LIGATION  1998   Social History   Socioeconomic History  . Marital status: Married    Spouse name: Not on file  . Number of children: Not on file  . Years of education: Not on file  . Highest education level: Not on file  Occupational History  . Not on file  Social Needs  . Financial resource strain: Not on file  . Food insecurity    Worry: Not on file    Inability: Not on file  . Transportation needs    Medical: Not on file    Non-medical: Not on file  Tobacco Use  . Smoking status: Former Smoker    Packs/day: 0.50    Years: 10.00    Pack years: 5.00    Types: Cigarettes    Quit date: 1995    Years since quitting: 25.9  . Smokeless tobacco: Never Used  Substance and Sexual Activity  . Alcohol use: No  . Drug use: No  . Sexual activity: Yes    Birth control/protection: None  Lifestyle  . Physical activity    Days per week: Not on file    Minutes per session: Not on file  . Stress: Not on file  Relationships  . Social Herbalist on phone: Not on file    Gets together: Not on file    Attends religious service: Not on file    Active member of club or organization: Not on file    Attends  meetings of clubs or organizations: Not on file    Relationship status: Not on file  Other Topics Concern  . Not on file  Social History Narrative  . Not on file   Outpatient Encounter Medications as of 05/20/2019  Medication Sig  . ibuprofen (ADVIL,MOTRIN) 800 MG tablet Take 1 tablet (800 mg total) by mouth every 8 (eight) hours as needed. (Patient not taking: Reported on 04/10/2017)  . levothyroxine (SYNTHROID) 25 MCG tablet Take 1.5 tablets (37.5 mcg total) by mouth daily before breakfast.  . [DISCONTINUED] levothyroxine (SYNTHROID, LEVOTHROID) 25 MCG tablet Take 1.5 tablets (37.5 mcg total) by mouth daily before breakfast.   No facility-administered encounter medications on file as of 05/20/2019.    ALLERGIES: Allergies  Allergen Reactions  . Sulfa Antibiotics Hives    VACCINATION STATUS: Immunization History  Administered Date(s) Administered  . Influenza Inj Mdck Quad Pf 06/04/2018  . Influenza,inj,Quad PF,6+ Mos 06/04/2017, 04/19/2019  . Influenza-Unspecified 06/04/2018  . PPD Test 03/22/2019    HPI Lori Koch is  43 y.o. female who presents today with a medical history as above. she is being engaged in telehealth via telephone in follow-up for her hypothyroidism.     -She has no new complaints today.   - She says she was diagnosed with hypothyroidism in 2011 when she was initiated on levothyroxine currently on 37.5 g by mouth every morning.   - She is also known to have nodular goiter, previsit thyroid ultrasound is unremarkable.Her  Previous  thyroid ultrasound shows overall decrease in size of bilateral thyroid lobes and decrease in size of thyroid nodule in the right lobe to 1.5 cm from 1.7 cm.  This nodule had no suspicious features.  -She has a steady weight.  Has no new complaints.   She has diet controlled type 2 diabetes with a reported A1c of 4.5%. -She denies palpitations, hot/cold intolerance. She denies dysphagia, shortness of breath, nor voice  change. -She denies exposure to neck radiation. - She is a former smoker. - She has 3 grown kids status post tubal ligation in 1998.  Review of Systems Limited as above.  Objective:    There were no vitals taken for this visit.  Wt Readings from Last 3 Encounters:  01/20/19 146 lb 9.6 oz (66.5 kg)  11/10/18 146 lb (66.2 kg)  05/11/18 146 lb (66.2 kg)    Physical Exam    TSH from 03/10/2017 was 1.85 Recent Results (from the past 2160 hour(s))  PPD     Status: None   Collection Time: 03/24/19  2:04 PM  Result Value Ref Range   TB Skin Test Negative    Induration 0 mm  T4, free     Status: None   Collection Time: 05/11/19 12:00 AM  Result Value Ref Range   Free T4 1.32 0.82 - 1.77 ng/dL  TSH     Status: None   Collection Time: 05/11/19 12:00 AM  Result Value Ref Range   TSH 1.540 0.450 - 4.500 uIU/mL   Thyroid ultrasound from 04/25/2017 showed right lobe 4.6 cm with 1.5 cm nodule; left lobe 3.3 cm with no nodules.   Thyroid ultrasound on May 04, 2018: Right lobe decrease in size to 3.6 cm from four-point recliner, left lobe decreased to 3.1 cm from 3.3 cm.  1.5 cm nodule on the right lobe, previously 1.7 cm.  Thyroid ultrasound on May 17, 2019 -Confirm 2-year stability of 1.6 cm category 3 nodule in the right medial gland.  Recommend continued follow-up every 1-2 years until 5-year stability has been confirmed.  Assessment & Plan:   1. Hypothyroidism 2. Thyroid nodule -Her previsit thyroid function tests are consistent with appropriate replacement.   -She is advised to continue  levothyroxine to 37.5 mcg p.o. daily before breakfast.   - We discussed about the correct intake of her thyroid hormone, on empty stomach at fasting, with water, separated by at least 30 minutes from breakfast and other medications,  and separated by more than 4 hours from calcium, iron, multivitamins, acid reflux medications (PPIs). -Patient is made aware of the fact that thyroid  hormone replacement is needed for life, dose to be adjusted by periodic monitoring of thyroid function tests.  - Regarding 1.5 cm nodule on the right lobe with no suspicious features, she will not need immediate intervention. Her surveillance ultrasound before this visit is unremarkable.  See above.       - I advised patient to maintain close follow up with Raliegh Ip, DO for primary care needs.  Time for this visit: 15 minutes. Billey ChangLori C Delcastillo  participated in the discussions, expressed understanding, and voiced agreement with the above plans.  All questions were answered to her satisfaction. she is encouraged to contact clinic should she have any questions or concerns prior to her return visit.   Follow up plan: Return in about 6 months (around 11/18/2019) for Follow up with Pre-visit Labs.  Marquis LunchGebre Fortino Haag, MD [facility] Phone: [phone]  Fax: [phone]   05/20/2019, 1:53 PM This note was partially dictated with voice recognition software. Similar sounding words can be transcribed inadequately or may not  be corrected upon review.

## 2019-10-04 ENCOUNTER — Ambulatory Visit (INDEPENDENT_AMBULATORY_CARE_PROVIDER_SITE_OTHER): Payer: Self-pay | Admitting: Family Medicine

## 2019-10-04 DIAGNOSIS — M47812 Spondylosis without myelopathy or radiculopathy, cervical region: Secondary | ICD-10-CM

## 2019-10-04 MED ORDER — PREDNISONE 10 MG (21) PO TBPK: ORAL_TABLET | ORAL | 0 refills | Status: DC

## 2019-10-04 NOTE — Patient Instructions (Signed)
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
## 2019-10-07 ENCOUNTER — Ambulatory Visit (INDEPENDENT_AMBULATORY_CARE_PROVIDER_SITE_OTHER): Payer: Self-pay

## 2019-10-07 ENCOUNTER — Ambulatory Visit: Payer: Self-pay | Admitting: Orthopaedic Surgery

## 2019-10-07 ENCOUNTER — Other Ambulatory Visit: Payer: Self-pay

## 2019-10-07 ENCOUNTER — Encounter: Payer: Self-pay | Admitting: Orthopaedic Surgery

## 2019-10-07 VITALS — BP 126/86 | HR 77 | Ht 62.0 in | Wt 149.0 lb

## 2019-10-07 DIAGNOSIS — G8929 Other chronic pain: Secondary | ICD-10-CM

## 2019-10-07 DIAGNOSIS — M542 Cervicalgia: Secondary | ICD-10-CM

## 2019-10-07 DIAGNOSIS — M545 Low back pain, unspecified: Secondary | ICD-10-CM
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
## 2019-11-10 ENCOUNTER — Telehealth: Payer: Self-pay | Admitting: "Endocrinology

## 2019-11-10 DIAGNOSIS — E039 Hypothyroidism, unspecified: Secondary | ICD-10-CM

## 2019-11-10 NOTE — Telephone Encounter (Signed)
Lab orders changed to [facility] 

## 2019-11-10 NOTE — Telephone Encounter (Signed)
Can you change order to [facility] 

## 2019-11-16 ENCOUNTER — Other Ambulatory Visit: Payer: Self-pay

## 2019-11-17 LAB — TSH: TSH: 1.91 u[IU]/mL (ref 0.450–4.500)

## 2019-11-17 LAB — T4, FREE: Free T4: 1.31 ng/dL (ref 0.82–1.77)

## 2019-11-18 ENCOUNTER — Ambulatory Visit (INDEPENDENT_AMBULATORY_CARE_PROVIDER_SITE_OTHER): Payer: Self-pay | Admitting: "Endocrinology

## 2019-11-18 ENCOUNTER — Encounter: Payer: Self-pay | Admitting: "Endocrinology

## 2019-11-18 ENCOUNTER — Other Ambulatory Visit: Payer: Self-pay

## 2019-11-18 VITALS — BP 101/71 | HR 74 | Ht 62.0 in | Wt 149.6 lb

## 2019-11-18 DIAGNOSIS — E039 Hypothyroidism, unspecified: Secondary | ICD-10-CM

## 2019-11-18 DIAGNOSIS — E041 Nontoxic single thyroid nodule: Secondary | ICD-10-CM

## 2019-11-18 MED ORDER — LEVOTHYROXINE SODIUM 25 MCG PO TABS: 37.5000 ug | ORAL_TABLET | Freq: Every day | ORAL | 1 refills | Status: DC

## 2019-11-18 NOTE — Progress Notes (Signed)
11/18/2019        Endocrinology follow-up note    Subjective:    Patient ID: Lori Koch, female    DOB: 1975/09/20, PCP Janora Norlander, DO   Past Medical History:  Diagnosis Date  . Diabetes mellitus without complication ([place]) 4818   diet only  . Thyroid disease    hypothyroidism   Past Surgical History:  Procedure Laterality Date  . TUBAL LIGATION  1998   Social History   Socioeconomic History  . Marital status: Married    Spouse name: Not on file  . Number of children: Not on file  . Years of education: Not on file  . Highest education level: Not on file  Occupational History  . Not on file  Tobacco Use  . Smoking status: Former Smoker    Packs/day: 0.50    Years: 10.00    Pack years: 5.00    Types: Cigarettes    Quit date: 1995    Years since quitting: 26.4  . Smokeless tobacco: Never Used  Substance and Sexual Activity  . Alcohol use: No  . Drug use: No  . Sexual activity: Yes    Birth control/protection: None  Other Topics Concern  . Not on file  Social History Narrative  . Not on file   Social Determinants of Health   Financial Resource Strain:   . Difficulty of Paying Living Expenses:   Food Insecurity:   . Worried About Charity fundraiser in the Last Year:   . Arboriculturist in the Last Year:   Transportation Needs:   . Film/video editor (Medical):   Marland Kitchen Lack of Transportation (Non-Medical):   Physical Activity:   . Days of Exercise per Week:   . Minutes of Exercise per Session:   Stress:   . Feeling of Stress :   Social Connections:   . Frequency of Communication with Friends and Family:   . Frequency of Social Gatherings with Friends and Family:   . Attends Religious Services:   . Active Member of Clubs or Organizations:   . Attends Archivist Meetings:   Marland Kitchen Marital Status:    Outpatient Encounter Medications as of 11/18/2019  Medication Sig  . levothyroxine (SYNTHROID) 25 MCG  tablet Take 1.5 tablets (37.5 mcg total) by mouth daily before breakfast.  . predniSONE (STERAPRED UNI-PAK 21 TAB) 10 MG (21) TBPK tablet As directed x 6 days  . [DISCONTINUED] levothyroxine (SYNTHROID) 25 MCG tablet Take 1.5 tablets (37.5 mcg total) by mouth daily before breakfast.   No facility-administered encounter medications on file as of 11/18/2019.   ALLERGIES: Allergies  Allergen Reactions  . Sulfa Antibiotics Hives    VACCINATION STATUS: Immunization History  Administered Date(s) Administered  . Influenza Inj Mdck Quad Pf 06/04/2018  . Influenza,inj,Quad PF,6+ Mos 06/04/2017, 04/19/2019  . Influenza-Unspecified 06/04/2018  . PPD Test 03/22/2019    HPI Lori Koch is 44 y.o. female who presents today with a medical history as above. she is being seen in follow-up for hypothyroidism, nodular goiter.   -She has no new complaints today.   - She says she was diagnosed with hypothyroidism in 2011 when she was initiated on levothyroxine currently on 37.5 g by mouth every morning.   - She is also known to have nodular goiter, previsit thyroid ultrasound is significant for similar findings compared to prior studies.  Previous  thyroid ultrasound shows overall decrease in size  of bilateral thyroid lobes and decrease in size of thyroid nodule in the right lobe to 1.5 cm from 1.7 cm.  This nodule had no suspicious features.  -She has a steady weight.  Has no new complaints.   She has diet controlled type 2 diabetes with a reported A1c of 4.5%. -She denies palpitations, hot/cold intolerance. She denies dysphagia, shortness of breath, nor voice change. -She denies exposure to neck radiation. - She is a former smoker. - She has 3 grown kids status post tubal ligation in 1998.  Review of Systems Limited as above.  Objective:    BP 101/71   Pulse 74   Ht 5\' 2"  (1.575 m)   Wt 149 lb 9.6 oz (67.9 kg)   BMI 27.36 kg/m   Wt Readings from Last 3 Encounters:  11/18/19 149 lb 9.6  oz (67.9 kg)  10/07/19 149 lb (67.6 kg)  01/20/19 146 lb 9.6 oz (66.5 kg)    Physical Exam    Physical Exam- Limited  Constitutional:  Body mass index is 27.36 kg/m. , not in acute distress, normal state of mind Eyes:  EOMI, no exophthalmos Neck: Supple Thyroid: + Palpable thyroid, no gross goiter.   Respiratory: Adequate breathing efforts Musculoskeletal: no gross deformities, strength intact in all four extremities, no gross restriction of joint movements Skin:  no rashes, no hyperemia Neurological: no tremor with outstretched hands,   TSH from 03/10/2017 was 1.85 Recent Results (from the past 2160 hour(s))  TSH     Status: None   Collection Time: 11/16/19  8:18 AM  Result Value Ref Range   TSH 1.910 0.450 - 4.500 uIU/mL  T4, Free     Status: None   Collection Time: 11/16/19  8:18 AM  Result Value Ref Range   Free T4 1.31 0.82 - 1.77 ng/dL   Thyroid ultrasound from 04/25/2017 showed right lobe 4.6 cm with 1.5 cm nodule; left lobe 3.3 cm with no nodules.   Thyroid ultrasound on May 04, 2018: Right lobe decrease in size to 3.6 cm from four-point recliner, left lobe decreased to 3.1 cm from 3.3 cm.  1.5 cm nodule on the right lobe, previously 1.7 cm.  Thyroid ultrasound on May 17, 2019 -Confirm 2-year stability of 1.6 cm category 3 nodule in the right medial gland.  Recommend continued follow-up every 1-2 years until 5-year stability has been confirmed.  Assessment & Plan:   1. Hypothyroidism 2. Thyroid nodule -Her previsit thyroid function tests are consistent with appropriate replacement.  She is advised to continue  levothyroxine to 37.5 mcg p.o. daily before breakfast.   - We discussed about the correct intake of her thyroid hormone, on empty stomach at fasting, with water, separated by at least 30 minutes from breakfast and other medications,  and separated by more than 4 hours from calcium, iron, multivitamins, acid reflux medications (PPIs). -Patient is  made aware of the fact that thyroid hormone replacement is needed for life, dose to be adjusted by periodic monitoring of thyroid function tests.   - Regarding 1.5 cm nodule on the right lobe with no suspicious features, she will need repeat surveillance ultrasound before her next visit in 1 year.    - I advised patient to maintain close follow up with May 19, 2019, DO for primary care needs.      - Time spent on this patient care encounter:  20 minutes of which 50% was spent in  counseling and the rest reviewing  her current and  previous labs / studies and medications  doses and developing a plan for long term care. [name]  participated in the discussions, expressed understanding, and voiced agreement with the above plans.  All questions were answered to her satisfaction. she is encouraged to contact clinic should she have any questions or concerns prior to her return visit.   Follow up plan: Return in about 1 year (around 11/17/2020) for F/U with Pre-visit Labs, Thyroid / Neck Ultrasound.  Marquis Lunch, MD [facility] Phone: [phone]  Fax: [phone]   11/18/2019, 6:00 PM This note was partially dictated with voice recognition software. Similar sounding words can be transcribed inadequately or may not  be corrected upon review.

## 2019-11-24 ENCOUNTER — Encounter: Payer: Self-pay | Admitting: "Endocrinology

## 2019-12-06 ENCOUNTER — Other Ambulatory Visit: Payer: Self-pay

## 2019-12-06 ENCOUNTER — Ambulatory Visit (INDEPENDENT_AMBULATORY_CARE_PROVIDER_SITE_OTHER): Payer: Self-pay | Admitting: Family

## 2019-12-06 ENCOUNTER — Encounter: Payer: Self-pay | Admitting: Family

## 2019-12-06 VITALS — BP 119/81 | HR 79 | Temp 97.9°F | Ht 62.0 in | Wt 150.8 lb

## 2019-12-06 DIAGNOSIS — F410 Panic disorder [episodic paroxysmal anxiety] without agoraphobia: Secondary | ICD-10-CM

## 2019-12-06 DIAGNOSIS — F411 Generalized anxiety disorder: Secondary | ICD-10-CM

## 2019-12-06 MED ORDER — BUSPIRONE HCL 5 MG PO TABS: 5.0000 mg | ORAL_TABLET | Freq: Three times a day (TID) | ORAL | 1 refills | Status: DC | PRN

## 2019-12-06 MED ORDER — CITALOPRAM HYDROBROMIDE 20 MG PO TABS: 20.0000 mg | ORAL_TABLET | Freq: Every day | ORAL | 1 refills | Status: DC

## 2019-12-06 NOTE — Progress Notes (Signed)
Subjective:    Patient ID: Lori Koch, female    DOB: 1975/11/14, 44 y.o.   MRN: [id number]  Chief Complaint  Patient presents with  . Panic Attack   Pt presents to the office today with increased anxiety and GAD. She states she has had anxiety for years, but over the last month it has become worse. She has a great deal of stress at home. She has a 52 year old daughter with autism, bipolar that she cares for. Her husband's mother is in hospice and having to deal the funeral arrangements. She is also caring for her grandmother who has dementia.  Anxiety Presents for initial visit. Symptoms include depressed mood, excessive worry, irritability, malaise, nervous/anxious behavior, palpitations, panic and restlessness. Patient reports no suicidal ideas.        Review of Systems  Constitutional: Positive for irritability.  Cardiovascular: Positive for palpitations.  Psychiatric/Behavioral: Negative for suicidal ideas. The patient is nervous/anxious.   All other systems reviewed and are negative.      Objective:   Physical Exam Vitals reviewed.  Constitutional:      General: She is not in acute distress.    Appearance: She is well-developed.  HENT:     Head: Normocephalic and atraumatic.     Right Ear: Tympanic membrane normal.     Left Ear: Tympanic membrane normal.  Eyes:     Pupils: Pupils are equal, round, and reactive to light.  Neck:     Thyroid: No thyromegaly.  Cardiovascular:     Rate and Rhythm: Normal rate and regular rhythm.     Heart sounds: Normal heart sounds. No murmur heard.   Pulmonary:     Effort: Pulmonary effort is normal. No respiratory distress.     Breath sounds: Normal breath sounds. No wheezing.  Abdominal:     General: Bowel sounds are normal. There is no distension.     Palpations: Abdomen is soft.     Tenderness: There is no abdominal tenderness.  Musculoskeletal:        General: No tenderness. Normal range of motion.     Cervical  back: Normal range of motion and neck supple.  Skin:    General: Skin is warm and dry.  Neurological:     Mental Status: She is alert and oriented to person, place, and time.     Cranial Nerves: No cranial nerve deficit.     Deep Tendon Reflexes: Reflexes are normal and symmetric.  Psychiatric:        Behavior: Behavior normal.        Thought Content: Thought content normal.        Judgment: Judgment normal.      BP 119/81   Pulse 79   Temp 97.9 F (36.6 C) (Temporal)   Ht 5\' 2"  (1.575 m)   Wt 150 lb 12.8 oz (68.4 kg)   SpO2 98%   BMI 27.58 kg/m       Assessment & Plan:  Lori Koch comes in today with chief complaint of Panic Attack   Diagnosis and orders addressed:  1. GAD (generalized anxiety disorder) - citalopram (CELEXA) 20 MG tablet; Take 1 tablet (20 mg total) by mouth daily.  Dispense: 90 tablet; Refill: 1 - busPIRone (BUSPAR) 5 MG tablet; Take 1 tablet (5 mg total) by mouth 3 (three) times daily as needed.  Dispense: 90 tablet; Refill: 1  2. Panic attack - citalopram (CELEXA) 20 MG tablet; Take 1 tablet (20 mg total) by  mouth daily.  Dispense: 90 tablet; Refill: 1 - busPIRone (BUSPAR) 5 MG tablet; Take 1 tablet (5 mg total) by mouth 3 (three) times daily as needed.  Dispense: 90 tablet; Refill: 1   Will start Celexa 20 mg daily  and Buspar 5 mg  Prn today Stress management discussed RTO in 4 weeks to recheck   Evelina Dun, FNP

## 2019-12-06 NOTE — Patient Instructions (Signed)

## 2019-12-14 ENCOUNTER — Telehealth: Payer: Self-pay | Admitting: *Deleted

## 2019-12-14 NOTE — Telephone Encounter (Signed)
Patient having weakness as if she has had a muscle relaxer. Ate food purchased from [employer] for supper the night before but nothing unusual.Advised her to go to urgent care or hospital if continues or gets worse.

## 2019-12-20 ENCOUNTER — Other Ambulatory Visit: Payer: Self-pay | Admitting: Family

## 2019-12-20 DIAGNOSIS — F410 Panic disorder [episodic paroxysmal anxiety] without agoraphobia: Secondary | ICD-10-CM

## 2019-12-20 DIAGNOSIS — F411 Generalized anxiety disorder: Secondary | ICD-10-CM

## 2019-12-21 NOTE — Telephone Encounter (Signed)
Should we refill for 30 days?

## 2019-12-21 NOTE — Telephone Encounter (Signed)
She should have another refill (just started 11/2019) given 60 days of meds.

## 2019-12-21 NOTE — Telephone Encounter (Signed)
No.  She is being titrated up on this medication.  Please make sure she has a follow up visit scheduled so that we can adjust her medication.

## 2019-12-21 NOTE — Telephone Encounter (Signed)
Next OV 01/12/20

## 2020-01-12 ENCOUNTER — Ambulatory Visit (INDEPENDENT_AMBULATORY_CARE_PROVIDER_SITE_OTHER): Payer: Self-pay | Admitting: Family Medicine

## 2020-01-12 DIAGNOSIS — F439 Reaction to severe stress, unspecified: Secondary | ICD-10-CM

## 2020-01-12 DIAGNOSIS — F411 Generalized anxiety disorder: Secondary | ICD-10-CM

## 2020-01-12 DIAGNOSIS — F418 Other specified anxiety disorders: Secondary | ICD-10-CM

## 2020-01-12 DIAGNOSIS — F410 Panic disorder [episodic paroxysmal anxiety] without agoraphobia: Secondary | ICD-10-CM

## 2020-01-12 MED ORDER — BUSPIRONE HCL 5 MG PO TABS: 5.0000 mg | ORAL_TABLET | Freq: Three times a day (TID) | ORAL | 3 refills | Status: DC | PRN

## 2020-01-12 NOTE — Progress Notes (Signed)
Telephone visit  Subjective: CC: f/u GAD PCP: Raliegh Ip, DO IEP:PIRJ C Labrador is a 44 y.o. female calls for telephone consult today. Patient provides verbal consent for consult held via phone.  Due to COVID-19 pandemic this visit was conducted virtually. This visit type was conducted due to national recommendations for restrictions regarding the COVID-19 Pandemic (e.g. social distancing, sheltering in place) in an effort to limit this patient's exposure and mitigate transmission in our community. All issues noted in this document were discussed and addressed.  A physical exam was not performed with this format.   Location of patient: home Location of provider: [facility] Others present for call: none  1.  Seizure-like anxiety Patient was seen 4 weeks ago for generalized anxiety disorder with exacerbation situationally.  She notes that her husband's parent passed away and he is subsequently been unstable.  Of note he does have schizophrenia and bipolar disorder.  Her daughter also is affected by both blood disorder and has also been an exacerbation since this occurrence.  She is stable on the Celexa 20 mg and BuSpar 5 mg 3 times daily, which was started 4 weeks ago.  She feels these medications are helping.  Does not desire any dose adjustment today.   ROS: Per HPI  Allergies  Allergen Reactions  . Sulfa Antibiotics Hives   Past Medical History:  Diagnosis Date  . Diabetes mellitus without complication ([facility]) 2019   diet only  . Thyroid disease    hypothyroidism    Current Outpatient Medications:  .  busPIRone (BUSPAR) 5 MG tablet, Take 1 tablet (5 mg total) by mouth 3 (three) times daily as needed., Disp: 90 tablet, Rfl: 1 .  citalopram (CELEXA) 20 MG tablet, Take 1 tablet (20 mg total) by mouth daily., Disp: 90 tablet, Rfl: 1 .  levothyroxine (SYNTHROID) 25 MCG tablet, Take 1.5 tablets (37.5 mcg total) by mouth daily before breakfast., Disp: 135 tablet, Rfl: 1  Gen: Normal  speech.  Normal insight.  Pleasant.  Assessment/ Plan: 44 y.o. female   1. Stress at home Still working through stress  2. Situational anxiety  3. GAD (generalized anxiety disorder) Stable with the BuSpar.  I renewed her medication.  She will contact me back prior to her 3-month follow-up if symptoms are worsening - busPIRone (BUSPAR) 5 MG tablet; Take 1 tablet (5 mg total) by mouth 3 (three) times daily as needed.  Dispense: 90 tablet; Refill: 3  4. Panic attack - busPIRone (BUSPAR) 5 MG tablet; Take 1 tablet (5 mg total) by mouth 3 (three) times daily as needed.  Dispense: 90 tablet; Refill: 3   Start time: 11:04am End time: 11:07am  Total time spent on patient care (including telephone call/ virtual visit): 9 minutes  Jarvis Sawa Hulen Skains, DO [facility] [phone]

## 2020-01-30 ENCOUNTER — Other Ambulatory Visit: Payer: Self-pay | Admitting: "Endocrinology

## 2020-03-20 ENCOUNTER — Ambulatory Visit: Payer: Self-pay | Admitting: Family Medicine

## 2020-03-22 ENCOUNTER — Encounter: Payer: Self-pay | Admitting: Family Medicine

## 2020-05-16 ENCOUNTER — Telehealth: Payer: Self-pay

## 2020-05-16 NOTE — Telephone Encounter (Signed)
Pt called to schedule ER follow up for joint pain/foot pain. Was recommended that she see her PCP to get a referral to see a Rheumatologist.   Wants to know if Dr Nadine Counts can work her in to be seen for in person or televisit today if possible.  Please advise and call patient. [phone]

## 2020-05-16 NOTE — Telephone Encounter (Signed)
Spoke with patient, first available appointment with Dr. Nadine Counts is 05/31/20.  Scheduled patient then at 8:30 am.

## 2020-05-17 ENCOUNTER — Ambulatory Visit (INDEPENDENT_AMBULATORY_CARE_PROVIDER_SITE_OTHER): Payer: Self-pay | Admitting: Family Medicine

## 2020-05-17 ENCOUNTER — Encounter: Payer: Self-pay | Admitting: Family Medicine

## 2020-05-17 ENCOUNTER — Other Ambulatory Visit: Payer: Self-pay

## 2020-05-17 VITALS — BP 117/73 | HR 66 | Temp 97.0°F | Ht 62.0 in | Wt 157.0 lb

## 2020-05-17 DIAGNOSIS — M2550 Pain in unspecified joint: Secondary | ICD-10-CM

## 2020-05-17 MED ORDER — PREDNISONE 20 MG PO TABS: ORAL_TABLET | ORAL | 0 refills | Status: DC

## 2020-05-17 NOTE — Progress Notes (Signed)
BP 117/73   Pulse 66   Temp (!) 97 F (36.1 C)   Ht 5\' 2"  (1.575 m)   Wt 157 lb (71.2 kg)   SpO2 100%   BMI 28.72 kg/m    Subjective:   Patient ID: , female    DOB: Sep 10, 1975, 44 y.o.   MRN: [id number]  HPI: Lori Koch is a 44 y.o. female presenting on 05/17/2020 for Generalized Body Aches (c/o joint pain)   HPI Patient has a history of Graves' disease and comes in today complaining of multiple joints hurting sporadically over the past few weeks.  And even more over the past few months. She says it all started with her foot and having pains in her ankle and the ball of her foot a couple months ago and now has extended into her left hip over the past couple days and her right elbow and now into both wrists and fingers over the past few days as well.  She does say she has a family history of of autoimmune diseases and rheumatoid arthritis in her mother.  She says she just keeps getting these aches off and on like this over the past few months at least.  She denies any fevers or chills.  She denies any cough or congestion or illness.  She has been using Tylenol and ibuprofen and they do not seem to be helping.  Relevant past medical, surgical, family and social history reviewed and updated as indicated. Interim medical history since our last visit reviewed. Allergies and medications reviewed and updated.  Review of Systems  Constitutional: Negative for chills and fever.  HENT: Negative for congestion, ear discharge and ear pain.   Eyes: Negative for redness and visual disturbance.  Respiratory: Negative for chest tightness and shortness of breath.   Cardiovascular: Negative for chest pain and leg swelling.  Genitourinary: Negative for difficulty urinating and dysuria.  Musculoskeletal: Positive for arthralgias, gait problem and joint swelling. Negative for back pain.  Skin: Negative for rash.  Neurological: Negative for light-headedness and headaches.   Psychiatric/Behavioral: Negative for agitation and behavioral problems.  All other systems reviewed and are negative.   Per HPI unless specifically indicated above   Allergies as of 05/17/2020      Reactions   Sulfa Antibiotics Hives      Medication List       Accurate as of May 17, 2020 10:00 AM. If you have any questions, ask your nurse or doctor.        busPIRone 5 MG tablet Commonly known as: BUSPAR Take 1 tablet (5 mg total) by mouth 3 (three) times daily as needed.   citalopram 20 MG tablet Commonly known as: CeleXA Take 1 tablet (20 mg total) by mouth daily.   levothyroxine 25 MCG tablet Commonly known as: SYNTHROID TAKE 1.5 TABLETS (37.5 MCG TOTAL) BY MOUTH DAILY BEFORE BREAKFAST.   predniSONE 20 MG tablet Commonly known as: DELTASONE 2 po at same time daily for 5 days Started by: May 19, 2020 Claretha Townshend, MD        Objective:   BP 117/73   Pulse 66   Temp (!) 97 F (36.1 C)   Ht 5\' 2"  (1.575 m)   Wt 157 lb (71.2 kg)   SpO2 100%   BMI 28.72 kg/m   Wt Readings from Last 3 Encounters:  05/17/20 157 lb (71.2 kg)  12/06/19 150 lb 12.8 oz (68.4 kg)  11/18/19 149 lb 9.6 oz (67.9 kg)  Physical Exam Vitals and nursing note reviewed.  Constitutional:      General: She is not in acute distress.    Appearance: She is well-developed. She is not diaphoretic.  Eyes:     Conjunctiva/sclera: Conjunctivae normal.  Musculoskeletal:        General: Normal range of motion.     Right upper arm: Tenderness present. No deformity.     Left wrist: Tenderness present. No swelling or crepitus. Normal range of motion.     Right hand: Tenderness present. Normal range of motion. Normal strength. Normal sensation. Normal capillary refill.     Left hand: Tenderness present. No swelling or deformity. Normal range of motion. Normal strength. Normal sensation. Normal capillary refill.     Left hip: Tenderness present. No deformity, bony tenderness or crepitus. Normal range  of motion.  Skin:    General: Skin is warm and dry.     Findings: No rash.  Neurological:     Mental Status: She is alert and oriented to person, place, and time.     Coordination: Coordination normal.  Psychiatric:        Behavior: Behavior normal.       Assessment & Plan:   Problem List Items Addressed This Visit    None    Visit Diagnoses    Polyarthralgia    -  Primary   Relevant Medications   predniSONE (DELTASONE) 20 MG tablet   Other Relevant Orders   Arthritis Panel   CBC with Differential/Platelet   Sedimentation rate   High sensitivity CRP   ANA,IFA RA Diag Pnl w/rflx Tit/Patn      Will do testing for possible rheumatic illness such as rheumatoid arthritis, if anything comes back positive then will consider going to a rheumatologist.  We will do prednisone course. Follow up plan: Return if symptoms worsen or fail to improve.  Counseling provided for all of the vaccine components Orders Placed This Encounter  Procedures  . Arthritis Panel  . CBC with Differential/Platelet  . Sedimentation rate  . High sensitivity CRP  . ANA,IFA RA Diag Pnl w/rflx Tit/Patn    Arville Care, MD [facility] 05/17/2020, 10:00 AM

## 2020-05-19 LAB — ARTHRITIS PANEL
Basophils Absolute: 0 10*3/uL (ref 0.0–0.2)
Basos: 0 %
EOS (ABSOLUTE): 0 10*3/uL (ref 0.0–0.4)
Eos: 0 %
Hematocrit: 37.8 % (ref 34.0–46.6)
Hemoglobin: 12.9 g/dL (ref 11.1–15.9)
Immature Grans (Abs): 0 10*3/uL (ref 0.0–0.1)
Immature Granulocytes: 0 %
Lymphocytes Absolute: 1.2 10*3/uL (ref 0.7–3.1)
Lymphs: 22 %
MCH: 31.9 pg (ref 26.6–33.0)
MCHC: 34.1 g/dL (ref 31.5–35.7)
MCV: 94 fL (ref 79–97)
Monocytes Absolute: 0.5 10*3/uL (ref 0.1–0.9)
Monocytes: 9 %
Neutrophils Absolute: 3.6 10*3/uL (ref 1.4–7.0)
Neutrophils: 69 %
Platelets: 211 10*3/uL (ref 150–450)
RBC: 4.04 x10E6/uL (ref 3.77–5.28)
RDW: 11.6 % — ABNORMAL LOW (ref 11.7–15.4)
Rheumatoid fact SerPl-aCnc: 19 IU/mL — ABNORMAL HIGH (ref <14.0)
Sed Rate: 2 mm/hr (ref 0–32)
Uric Acid: 4.6 mg/dL (ref 2.6–6.2)
WBC: 5.2 10*3/uL (ref 3.4–10.8)

## 2020-05-19 LAB — ANA,IFA RA DIAG PNL W/RFLX TIT/PATN
ANA Titer 1: POSITIVE — AB
Cyclic Citrullin Peptide Ab: 124 units — ABNORMAL HIGH (ref 0–19)

## 2020-05-19 LAB — HIGH SENSITIVITY CRP: CRP, High Sensitivity: 2.43 mg/L (ref 0.00–3.00)

## 2020-05-19 LAB — FANA STAINING PATTERNS: Speckled Pattern: 1:80 {titer}

## 2020-05-23 ENCOUNTER — Other Ambulatory Visit: Payer: Self-pay | Admitting: Family Medicine

## 2020-05-23 ENCOUNTER — Telehealth: Payer: Self-pay

## 2020-05-23 DIAGNOSIS — M2550 Pain in unspecified joint: Secondary | ICD-10-CM

## 2020-05-23 DIAGNOSIS — M0580 Other rheumatoid arthritis with rheumatoid factor of unspecified site: Secondary | ICD-10-CM

## 2020-05-23 NOTE — Telephone Encounter (Signed)
Patient aware of results and states since her results can back can she get a referral for rheumatology.

## 2020-05-23 NOTE — Telephone Encounter (Signed)
Left message informing pt that Dr. Louanne Skye has referred her to Rheumatology. She should receive a phone call from their office within 2 weeks. If not, she should call our office back.

## 2020-05-23 NOTE — Telephone Encounter (Signed)
I had already placed a referral for rheumatology and I believe I did state that in my result note, thanks

## 2020-05-23 NOTE — Telephone Encounter (Signed)
Please call patient to go over lab results.  

## 2020-05-25 ENCOUNTER — Telehealth: Payer: Self-pay | Admitting: Family Medicine

## 2020-05-31 ENCOUNTER — Encounter: Payer: Self-pay | Admitting: Family Medicine

## 2020-05-31 ENCOUNTER — Ambulatory Visit: Payer: Self-pay | Admitting: Family Medicine

## 2020-06-12 ENCOUNTER — Other Ambulatory Visit: Payer: Self-pay

## 2020-06-12 DIAGNOSIS — Z20822 Contact with and (suspected) exposure to covid-19: Secondary | ICD-10-CM

## 2020-06-13 LAB — SARS-COV-2, NAA 2 DAY TAT

## 2020-06-13 LAB — NOVEL CORONAVIRUS, NAA: SARS-CoV-2, NAA: NOT DETECTED

## 2020-06-29 ENCOUNTER — Telehealth: Payer: Self-pay | Admitting: Family Medicine

## 2020-06-29 NOTE — Telephone Encounter (Signed)
She can take Mucinex,  If her symptoms worsen she needs to be seen.

## 2020-06-29 NOTE — Telephone Encounter (Signed)
Lmtcb.

## 2020-06-29 NOTE — Telephone Encounter (Signed)
Pt was calling to see what she can take for congestion because she has thyroid issues

## 2020-06-29 NOTE — Telephone Encounter (Signed)
Please review and advise- covering PCP

## 2020-06-30 NOTE — Telephone Encounter (Signed)
Returning nurse call.

## 2020-06-30 NOTE — Telephone Encounter (Signed)
Patient aware.

## 2020-07-30 ENCOUNTER — Other Ambulatory Visit: Payer: Self-pay | Admitting: "Endocrinology

## 2020-08-02 ENCOUNTER — Ambulatory Visit (INDEPENDENT_AMBULATORY_CARE_PROVIDER_SITE_OTHER): Payer: Self-pay

## 2020-08-02 ENCOUNTER — Other Ambulatory Visit: Payer: Self-pay

## 2020-08-02 DIAGNOSIS — Z23 Encounter for immunization: Secondary | ICD-10-CM

## 2020-08-02 NOTE — Progress Notes (Signed)
   Covid-19 Vaccination Clinic  Name:  Lori Koch    MRN: [id number] DOB: 20-Aug-1975  08/02/2020  [name] was observed post Covid-19 immunization for 15 minutes without incident. She was provided with Vaccine Information Sheet and instruction to access the V-Safe system.   [name] was instructed to call 911 with any severe reactions post vaccine: Marland Kitchen Difficulty breathing  . Swelling of face and throat  . A fast heartbeat  . A bad rash all over body  . Dizziness and weakness   Immunizations Administered    Name Date Dose VIS Date Route   PFIZER Comrnaty(Gray TOP) Covid-19 Vaccine 08/02/2020  2:46 PM 0.3 mL 05/25/2020 Intramuscular   Manufacturer: [employer], [employer]   Lot: [id number]   NDC: [phone]

## 2020-08-30 ENCOUNTER — Ambulatory Visit: Payer: Self-pay

## 2020-09-18 ENCOUNTER — Other Ambulatory Visit: Payer: Self-pay

## 2020-10-16 ENCOUNTER — Ambulatory Visit (HOSPITAL_COMMUNITY): Payer: Self-pay

## 2020-11-06 ENCOUNTER — Telehealth: Payer: Self-pay

## 2020-11-06 ENCOUNTER — Other Ambulatory Visit: Payer: Self-pay

## 2020-11-06 DIAGNOSIS — E039 Hypothyroidism, unspecified: Secondary | ICD-10-CM

## 2020-11-06 NOTE — Telephone Encounter (Signed)
New order put in/ Ultrasound order sent to schedulers

## 2020-11-06 NOTE — Telephone Encounter (Signed)
Patient needs update lab order and fax to [facility] (they said they can not see the order) also, she needs an ultrasound before her appt 6/6.

## 2020-11-07 ENCOUNTER — Other Ambulatory Visit: Payer: Self-pay

## 2020-11-08 LAB — T4, FREE: Free T4: 1.27 ng/dL (ref 0.82–1.77)

## 2020-11-08 LAB — TSH: TSH: 1.3 u[IU]/mL (ref 0.450–4.500)

## 2020-11-10 ENCOUNTER — Other Ambulatory Visit: Payer: Self-pay

## 2020-11-10 ENCOUNTER — Ambulatory Visit (HOSPITAL_COMMUNITY)
Admission: RE | Admit: 2020-11-10 | Discharge: 2020-11-10 | Disposition: A | Discharge status: Home / Self Care | Payer: Self-pay | Source: Ambulatory Visit | Attending: "Endocrinology | Admitting: "Endocrinology

## 2020-11-10 DIAGNOSIS — E041 Nontoxic single thyroid nodule: Secondary | ICD-10-CM | POA: Insufficient documentation

## 2020-11-20 ENCOUNTER — Encounter: Payer: Self-pay | Admitting: "Endocrinology

## 2020-11-20 ENCOUNTER — Other Ambulatory Visit: Payer: Self-pay

## 2020-11-20 ENCOUNTER — Ambulatory Visit (INDEPENDENT_AMBULATORY_CARE_PROVIDER_SITE_OTHER): Payer: Self-pay | Admitting: "Endocrinology

## 2020-11-20 VITALS — BP 110/78 | HR 56 | Ht 62.0 in | Wt 155.8 lb

## 2020-11-20 DIAGNOSIS — E042 Nontoxic multinodular goiter: Secondary | ICD-10-CM

## 2020-11-20 DIAGNOSIS — E039 Hypothyroidism, unspecified: Secondary | ICD-10-CM

## 2020-11-20 MED ORDER — LEVOTHYROXINE SODIUM 25 MCG PO TABS: 37.5000 ug | ORAL_TABLET | Freq: Every day | ORAL | 1 refills | Status: DC

## 2020-11-20 NOTE — Progress Notes (Signed)
11/20/2020        Endocrinology follow-up note    Subjective:    Patient ID: Lori Koch, female    DOB: 05-Feb-1976, PCP Lori Ip, DO   Past Medical History:  Diagnosis Date  . Diabetes mellitus without complication (HCC) 2019   diet only  . Thyroid disease    hypothyroidism   Past Surgical History:  Procedure Laterality Date  . TUBAL LIGATION  1998   Social History   Socioeconomic History  . Marital status: Married    Spouse name: Not on file  . Number of children: Not on file  . Years of education: Not on file  . Highest education level: Not on file  Occupational History  . Not on file  Tobacco Use  . Smoking status: Former Smoker    Packs/day: 0.50    Years: 10.00    Pack years: 5.00    Types: Cigarettes    Quit date: 1995    Years since quitting: 27.4  . Smokeless tobacco: Never Used  Vaping Use  . Vaping Use: Never used  Substance and Sexual Activity  . Alcohol use: No  . Drug use: No  . Sexual activity: Yes    Birth control/protection: None  Other Topics Concern  . Not on file  Social History Narrative  . Not on file   Social Determinants of Health   Financial Resource Strain: Not on file  Food Insecurity: Not on file  Transportation Needs: Not on file  Physical Activity: Not on file  Stress: Not on file  Social Connections: Not on file   Outpatient Encounter Medications as of 11/20/2020  Medication Sig  . Upadacitinib ER (RINVOQ) 15 MG TB24 Take 1 tablet by mouth daily in the afternoon.  . busPIRone (BUSPAR) 5 MG tablet Take 1 tablet (5 mg total) by mouth 3 (three) times daily as needed.  . citalopram (CELEXA) 20 MG tablet Take 1 tablet (20 mg total) by mouth daily.  Marland Kitchen levothyroxine (SYNTHROID) 25 MCG tablet Take 1.5 tablets (37.5 mcg total) by mouth daily before breakfast.  . predniSONE (DELTASONE) 20 MG tablet 2 po at same time daily for 5 days  . [DISCONTINUED] levothyroxine (SYNTHROID) 25 MCG  tablet TAKE 1.5 TABLETS (37.5 MCG TOTAL) BY MOUTH DAILY BEFORE BREAKFAST.   No facility-administered encounter medications on file as of 11/20/2020.   ALLERGIES: Allergies  Allergen Reactions  . Sulfa Antibiotics Hives    VACCINATION STATUS: Immunization History  Administered Date(s) Administered  . Influenza Inj Mdck Quad Pf 06/04/2018  . Influenza,inj,Quad PF,6+ Mos 06/04/2017, 04/19/2019  . Influenza-Unspecified 06/04/2018  . PFIZER Comirnaty(Gray Top)Covid-19 Tri-Sucrose Vaccine 08/02/2020  . PPD Test 03/22/2019    HPI Lori Koch is 45 y.o. female who presents today with a medical history as above. she is being seen in follow-up for hypothyroidism, multinodular goiter.  -She is on stable dose of levothyroxine 37.5 mcg p.o. daily before breakfast.  She continues to tolerate this medication.  -She has no new complaints today.   - She says she was diagnosed with hypothyroidism in 2011.  - She is also known to have nodular goiter, previsit thyroid ultrasound is significant for similar findings compared to prior studies.  Previous  thyroid ultrasound shows overall decrease in size of bilateral thyroid lobes and decrease in size of thyroid nodule in the right lobe to 1.5 cm from 1.7 cm.  This nodule had no suspicious features.  Her previsit  thyroid ultrasound documents similar findings. -In the interim, she was diagnosed with rheumatoid arthritis which required brief intervention with steroids which caused unintended weight gain of 5 pounds.      She has diet controlled type 2 diabetes with a reported A1c of 4.5%. -She denies palpitations, hot/cold intolerance. She denies dysphagia, shortness of breath, nor voice change. -She denies exposure to neck radiation. - She is a former smoker. - She has 3 grown kids status post tubal ligation in 1998.  Review of Systems Limited as above.  Objective:    BP 110/78   Pulse (!) 56   Ht 5\' 2"  (1.575 m)   Wt 155 lb 12.8 oz (70.7 kg)    BMI 28.50 kg/m   Wt Readings from Last 3 Encounters:  11/20/20 155 lb 12.8 oz (70.7 kg)  05/17/20 157 lb (71.2 kg)  12/06/19 150 lb 12.8 oz (68.4 kg)    Physical Exam    Physical Exam- Limited  Constitutional:  Body mass index is 28.5 kg/m. , not in acute distress, normal state of mind   TSH from 03/10/2017 was 1.85 Recent Results (from the past 2160 hour(s))  T4, Free     Status: None   Collection Time: 11/07/20  1:08 PM  Result Value Ref Range   Free T4 1.27 0.82 - 1.77 ng/dL  TSH     Status: None   Collection Time: 11/07/20  1:08 PM  Result Value Ref Range   TSH 1.300 0.450 - 4.500 uIU/mL   Thyroid ultrasound from 04/25/2017 showed right lobe 4.6 cm with 1.5 cm nodule; left lobe 3.3 cm with no nodules.   Thyroid ultrasound on May 04, 2018: Right lobe decrease in size to 3.6 cm from four-point recliner, left lobe decreased to 3.1 cm from 3.3 cm.  1.5 cm nodule on the right lobe, previously 1.7 cm.  Thyroid ultrasound on May 17, 2019 -Confirm 2-year stability of 1.6 cm category 3 nodule in the right medial gland.  Recommend continued follow-up every 1-2 years until 5-year stability has been confirmed.   Thyroid ultrasound on Nov 10, 2020:  IMPRESSION: 1. Nodule 1 located in the mid right thyroid lobe is not significantly changed in size since 04/25/2017. Follow-up ultrasound should be performed to document 5 years of stability. 2. Nodule 3 located in the left inferior thyroid lobe is not significantly changed in size since 05/17/2019. This meets criteria for surveillance. Follow-up thyroid ultrasound should be performed in 1 year.  Assessment & Plan:   1. Hypothyroidism 2. Thyroid nodule -Her previsit thyroid function tests are consistent with appropriate replacement.  She is advised to continue levothyroxine 37.5 mcg p.o. daily before breakfast.   - We discussed about the correct intake of her thyroid hormone, on empty stomach at fasting, with water,  separated by at least 30 minutes from breakfast and other medications,  and separated by more than 4 hours from calcium, iron, multivitamins, acid reflux medications (PPIs). -Patient is made aware of the fact that thyroid hormone replacement is needed for life, dose to be adjusted by periodic monitoring of thyroid function tests.  - Regarding 1.5 cm nodule on the right lobe and 0.9 cm nodule in the left lobe with no suspicious features, she will not need any intervention at this time.  She will be considered for 1 more surveillance ultrasound in 1 to 2 years.    - I advised patient to maintain close follow up with 05/19/2019, DO for primary care needs.  I spent 25 minutes in the care of the patient today including review of labs from Thyroid Function, CMP, and other relevant labs ; imaging/biopsy records (current and previous including abstractions from other facilities); face-to-face time discussing  her lab results and symptoms, medications doses, her options of short and long term treatment based on the latest standards of care / guidelines;   and documenting the encounter.  Lori Koch  participated in the discussions, expressed understanding, and voiced agreement with the above plans.  All questions were answered to her satisfaction. she is encouraged to contact clinic should she have any questions or concerns prior to her return visit.    Follow up plan: Return in about 6 months (around 05/22/2021) for F/U with Pre-visit Labs.  Marquis Lunch, MD [facility] Phone: [phone]  Fax: [phone]   11/20/2020, 3:05 PM This note was partially dictated with voice recognition software. Similar sounding words can be transcribed inadequately or may not  be corrected upon review.

## 2021-01-04 ENCOUNTER — Ambulatory Visit (INDEPENDENT_AMBULATORY_CARE_PROVIDER_SITE_OTHER): Payer: Self-pay | Admitting: Family Medicine

## 2021-01-04 ENCOUNTER — Other Ambulatory Visit: Payer: Self-pay

## 2021-01-04 ENCOUNTER — Encounter: Payer: Self-pay | Admitting: Family Medicine

## 2021-01-04 VITALS — BP 104/69 | HR 59 | Temp 99.3°F | Ht 62.0 in | Wt 150.6 lb

## 2021-01-04 DIAGNOSIS — N911 Secondary amenorrhea: Secondary | ICD-10-CM

## 2021-01-04 DIAGNOSIS — R350 Frequency of micturition: Secondary | ICD-10-CM

## 2021-01-04 LAB — MICROSCOPIC EXAMINATION
Bacteria, UA: NONE SEEN
Epithelial Cells (non renal): NONE SEEN /hpf (ref 0–10)
RBC, Urine: NONE SEEN /hpf (ref 0–2)
WBC, UA: NONE SEEN /hpf (ref 0–5)

## 2021-01-04 LAB — URINALYSIS, COMPLETE
Bilirubin, UA: NEGATIVE
Glucose, UA: NEGATIVE
Ketones, UA: NEGATIVE
Leukocytes,UA: NEGATIVE
Nitrite, UA: NEGATIVE
Protein,UA: NEGATIVE
RBC, UA: NEGATIVE
Specific Gravity, UA: <1.005 — ABNORMAL LOW (ref 1.005–1.030)
Urobilinogen, Ur: 0.2 mg/dL (ref 0.2–1.0)
pH, UA: 5.5 (ref 5.0–7.5)

## 2021-01-04 MED ORDER — MEDROXYPROGESTERONE ACETATE 5 MG PO TABS: 10.0000 mg | ORAL_TABLET | Freq: Every day | ORAL | 0 refills | Status: DC

## 2021-01-04 NOTE — Patient Instructions (Signed)
Secondary Amenorrhea  Secondary amenorrhea occurs when a female who was previously having menstrual periods has not had them for 3-6 months. A menstrual period is the monthly shedding of the lining of the uterus. The lining of the uterus is made up of blood, tissue, fluid, and mucus. The flow of blood usually occurs during 3-7consecutive days each month. This condition has many causes. In many cases, treating the underlying causewill return menstrual periods back to a normal cycle. What are the causes? The most common cause of this condition is pregnancy. Other medical conditions that can cause secondary amenorrhea include: Cirrhosis of the liver. Conditions of the blood. Diabetes. Epilepsy. Chronic kidney disease. Polycystic ovary disease. A hormonal imbalance. Ovarian failure. Cystic fibrosis. Early menopause. Cushing syndrome. Thyroid problems. Other causes may include: Malnutrition. Stress or anxiety. Medicines. Extreme obesity. Low body weight or drastic weight loss. Removal of the ovaries or uterus. Contraceptive pills, patches, or vaginal rings. What increases the risk? You are more likely to develop this condition if: You have a family history of this condition. You have an eating disorder. You do extreme athletic training. You have a chronic disease. You abuse substances such as alcohol or cigarettes. What are the signs or symptoms? The main symptom of this condition is a lack of menstrual periods for 3-6months in a female who previously had menstrual periods. How is this diagnosed? This condition may be diagnosed based on: Your medical history. A physical exam. A pelvic exam to check for problems with your reproductive organs. A procedure to examine the uterus. A measurement of your body mass index (BMI). You may also have other tests, including: Blood tests that measure certain hormones in your body and rule out pregnancy. Urine tests. Imaging tests, such as an  ultrasound, CT scan, or MRI. How is this treated? Treatment for this condition depends on the cause of the amenorrhea. It may involve: Correcting diet-related problems. Treating underlying conditions. Medicines. Lifestyle changes. Surgery. If the condition cannot be corrected, it is sometimes possible to startmenstrual periods with medicines. Follow these instructions at home: Lifestyle     Maintain a healthy diet. In general, a healthy diet includes lots of fruits and vegetables, low-fat dairy products, lean meats, and foods that contain fiber. Ask to meet with a registered dietitian for nutrition counseling and meal planning. Maintain a healthy weight. Talk to your health care provider before trying any new diet or exercise plan. Exercise at least 30 minutes 5 or more days each week. Exercising includes brisk walking, yard work, biking, running, swimming, and team sports like basketball and soccer. Ask your health care provider which exercises are safe for you. Get enough sleep. Plan your sleep time to allow for 7-9 hours of sleep each night. Learn to manage stress. Explore relaxation techniques such as meditation, journaling, yoga, or tai chi. General instructions Be aware of changes in your menstrual cycle. Keep a record of when you have your menstrual period. Note the date your period starts, how long it lasts, and any problems you experience. Take over-the-counter and prescription medicines only as told by your health care provider. Keep all follow-up visits. This is important. Contact a health care provider if: Your periods do not return to normal after treatment. Summary Secondary amenorrhea is when a female who was previously having menstrual periods has not gotten her period for 3-6 months. This condition has many causes. In many cases, treating the underlying cause will return menstrual periods back to a normal cycle. Talk to   your health care provider if your periods do not  return to normal after treatment. This information is not intended to replace advice given to you by your health care provider. Make sure you discuss any questions you have with your healthcare provider. Document Revised: 01/19/2020 Document Reviewed: 01/19/2020 Elsevier Patient Education  2022 Elsevier Inc.   

## 2021-01-04 NOTE — Progress Notes (Signed)
Acute Office Visit  Subjective:    Patient ID: Lori Koch, female    DOB: 1975/07/31, 45 y.o.   MRN: [id number]  Chief Complaint  Patient presents with   Abdominal Pain   Back Pain   Amenorrhea    HPI Patient is in today for amenorrhea.  She reports having a normal period on 11/29/20. She reports that this month she had spotting for 1 day on 12/29/20. Then no bleeding. She normally has heavy bleeding with her cycles. Cycles are normally regular. Since 12/29/20 she has felt tired. She has also had cramping in her lower back and lower abdomen. Also reports bloating and urinary frequency. Denies vaginal discharge or itching. Denies fever, chills, vomiting, diarrhea, urgency, or dysuria. She had had her tubes tied. She had a negative pregnancy test. Her thyroid level was checked in June and was normal.   Past Medical History:  Diagnosis Date   Diabetes mellitus without complication (HCC) 2019   diet only   Thyroid disease    hypothyroidism    Past Surgical History:  Procedure Laterality Date   TUBAL LIGATION  1998    Family History  Problem Relation Age of Onset   Cancer Mother 44       breast   Hypothyroidism Mother    Heart murmur Mother    Heart murmur Daughter    Bipolar disorder Daughter    Autism Daughter    Allergic Disorder Daughter    Asthma Daughter    Dementia Maternal Grandmother    Atrial fibrillation Maternal Grandmother    Thyroid disease Maternal Grandmother    Heart disease Maternal Grandfather    Heart attack Maternal Grandfather    Allergic Disorder Daughter     Social History   Socioeconomic History   Marital status: Married    Spouse name: Not on file   Number of children: Not on file   Years of education: Not on file   Highest education level: Not on file  Occupational History   Not on file  Tobacco Use   Smoking status: Former    Packs/day: 0.50    Years: 10.00    Pack years: 5.00    Types: Cigarettes    Quit date: 1995     Years since quitting: 27.5   Smokeless tobacco: Never  Vaping Use   Vaping Use: Never used  Substance and Sexual Activity   Alcohol use: No   Drug use: No   Sexual activity: Yes    Birth control/protection: None  Other Topics Concern   Not on file  Social History Narrative   Not on file   Social Determinants of Health   Financial Resource Strain: Not on file  Food Insecurity: Not on file  Transportation Needs: Not on file  Physical Activity: Not on file  Stress: Not on file  Social Connections: Not on file  Intimate Partner Violence: Not on file    Outpatient Medications Prior to Visit  Medication Sig Dispense Refill   busPIRone (BUSPAR) 5 MG tablet Take 1 tablet (5 mg total) by mouth 3 (three) times daily as needed. 90 tablet 3   citalopram (CELEXA) 20 MG tablet Take 1 tablet (20 mg total) by mouth daily. 90 tablet 1   levothyroxine (SYNTHROID) 25 MCG tablet Take 1.5 tablets (37.5 mcg total) by mouth daily before breakfast. 135 tablet 1   predniSONE (DELTASONE) 5 MG tablet Take 5 mg by mouth daily with breakfast.     Upadacitinib ER (RINVOQ)  15 MG TB24 Take 1 tablet by mouth daily in the afternoon.     predniSONE (DELTASONE) 20 MG tablet 2 po at same time daily for 5 days (Patient not taking: Reported on 01/04/2021) 10 tablet 0   No facility-administered medications prior to visit.    Allergies  Allergen Reactions   Sulfa Antibiotics Hives    Review of Systems As per HPI.    Objective:    Physical Exam Vitals and nursing note reviewed.  Constitutional:      General: She is not in acute distress.    Appearance: She is not ill-appearing, toxic-appearing or diaphoretic.  Pulmonary:     Effort: Pulmonary effort is normal. No respiratory distress.     Breath sounds: Normal breath sounds.  Abdominal:     General: Bowel sounds are normal. There is no distension.     Palpations: Abdomen is soft.     Tenderness: There is abdominal tenderness in the right lower  quadrant, suprapubic area and left lower quadrant. There is no right CVA tenderness, left CVA tenderness, guarding or rebound.  Skin:    General: Skin is warm and dry.  Neurological:     General: No focal deficit present.     Mental Status: She is alert and oriented to person, place, and time.  Psychiatric:        Mood and Affect: Mood normal.        Behavior: Behavior normal.    BP 104/69   Pulse (!) 59   Temp 99.3 F (37.4 C) (Temporal)   Ht 5\' 2"  (1.575 m)   Wt 150 lb 9.6 oz (68.3 kg)   LMP 11/29/2020   BMI 27.55 kg/m  Wt Readings from Last 3 Encounters:  01/04/21 150 lb 9.6 oz (68.3 kg)  11/20/20 155 lb 12.8 oz (70.7 kg)  05/17/20 157 lb (71.2 kg)    Health Maintenance Due  Topic Date Due   HIV Screening  Never done   Hepatitis C Screening  Never done   TETANUS/TDAP  Never done   PAP SMEAR-Modifier  Never done   COLONOSCOPY (Pts 45-39yrs Insurance coverage will need to be confirmed)  Never done   COVID-19 Vaccine (2 - Pfizer series) 08/23/2020    There are no preventive care reminders to display for this patient.   Lab Results  Component Value Date   TSH 1.300 11/07/2020   Lab Results  Component Value Date   WBC 5.2 05/17/2020   HGB 12.9 05/17/2020   HCT 37.8 05/17/2020   MCV 94 05/17/2020   PLT 211 05/17/2020   Lab Results  Component Value Date   NA 138 10/28/2017   K 4.2 10/28/2017   CO2 22 10/28/2017   GLUCOSE 82 10/28/2017   BUN 17 10/28/2017   CREATININE 0.78 10/28/2017   BILITOT 0.3 10/28/2017   ALKPHOS 63 10/28/2017   AST 18 10/28/2017   ALT 19 10/28/2017   PROT 6.3 10/28/2017   ALBUMIN 4.1 10/28/2017   CALCIUM 8.9 10/28/2017   No results found for: CHOL No results found for: HDL No results found for: LDLCALC No results found for: TRIG No results found for: CHOLHDL Lab Results  Component Value Date   HGBA1C 5.1 10/28/2017       Assessment & Plan:   Lori Koch was seen today for abdominal pain, back pain and amenorrhea.  Diagnoses  and all orders for this visit:  Frequent urination UA negative.  -     Urinalysis, Complete  Secondary amenorrhea  Negative home pregnancy test, hx of tubal ligation. Provera ordered as below.  -     medroxyPROGESTERone (PROVERA) 5 MG tablet; Take 2 tablets (10 mg total) by mouth daily for 5 days.  Return to office for new or worsening symptoms, or if symptoms persist.   The patient indicates understanding of these issues and agrees with the plan.  Gabriel Earing, FNP

## 2021-01-31 ENCOUNTER — Other Ambulatory Visit: Payer: Self-pay | Admitting: "Endocrinology

## 2021-02-28 ENCOUNTER — Ambulatory Visit: Payer: Self-pay | Admitting: Family Medicine

## 2021-02-28 ENCOUNTER — Other Ambulatory Visit: Payer: Self-pay

## 2021-02-28 ENCOUNTER — Other Ambulatory Visit (HOSPITAL_COMMUNITY)
Admission: RE | Admit: 2021-02-28 | Discharge: 2021-02-28 | Disposition: A | Discharge status: Home / Self Care | Payer: Self-pay | Source: Ambulatory Visit | Attending: Family Medicine | Admitting: Family Medicine

## 2021-02-28 ENCOUNTER — Encounter: Payer: Self-pay | Admitting: Family Medicine

## 2021-02-28 VITALS — BP 113/72 | HR 80 | Temp 98.4°F | Ht 62.0 in | Wt 143.6 lb

## 2021-02-28 DIAGNOSIS — F411 Generalized anxiety disorder: Secondary | ICD-10-CM

## 2021-02-28 DIAGNOSIS — Z124 Encounter for screening for malignant neoplasm of cervix: Secondary | ICD-10-CM

## 2021-02-28 DIAGNOSIS — F410 Panic disorder [episodic paroxysmal anxiety] without agoraphobia: Secondary | ICD-10-CM

## 2021-02-28 DIAGNOSIS — N926 Irregular menstruation, unspecified: Secondary | ICD-10-CM

## 2021-02-28 MED ORDER — CITALOPRAM HYDROBROMIDE 20 MG PO TABS: 20.0000 mg | ORAL_TABLET | Freq: Every day | ORAL | 3 refills | Status: DC

## 2021-02-28 MED ORDER — BUSPIRONE HCL 5 MG PO TABS: 5.0000 mg | ORAL_TABLET | Freq: Three times a day (TID) | ORAL | 3 refills | Status: DC | PRN

## 2021-02-28 NOTE — Progress Notes (Signed)
Subjective: CC: Irregular menses PCP: Raliegh Ip, DO EHM:CNOB C Lori Koch is a 45 y.o. female presenting to clinic today for:  1.  Irregular menses Patient was seen in July for irregular menses.  She was placed on Provera and she notes that her menstrual cycles have subsequently gotten back to normal.  She is coming up on her next menstrual cycle.  She would like to go ahead and have a Pap smear performed she has not had this done in many years.  Denies any concern for STDs.  No abnormal vaginal discharge or pelvic pain.  2.  Anxiety Patient reports that anxiety symptoms are stable with current medications.  She would like renewal on both.  She admits to some exacerbation intermittently when her daughter is around.  Her daughter is no longer living with her right now and they do not have a very good relationship currently.   ROS: Per HPI  Allergies  Allergen Reactions   Sulfa Antibiotics Hives   Past Medical History:  Diagnosis Date   Diabetes mellitus without complication (HCC) 2019   diet only   Thyroid disease    hypothyroidism    Current Outpatient Medications:    busPIRone (BUSPAR) 5 MG tablet, Take 1 tablet (5 mg total) by mouth 3 (three) times daily as needed., Disp: 90 tablet, Rfl: 3   citalopram (CELEXA) 20 MG tablet, Take 1 tablet (20 mg total) by mouth daily., Disp: 90 tablet, Rfl: 1   levothyroxine (SYNTHROID) 25 MCG tablet, TAKE 1.5 TABLETS (37.5 MCG TOTAL) BY MOUTH DAILY BEFORE BREAKFAST., Disp: 135 tablet, Rfl: 1   Upadacitinib ER (RINVOQ) 15 MG TB24, Take 1 tablet by mouth daily in the afternoon., Disp: , Rfl:  Social History   Socioeconomic History   Marital status: Married    Spouse name: Not on file   Number of children: Not on file   Years of education: Not on file   Highest education level: Not on file  Occupational History   Not on file  Tobacco Use   Smoking status: Former    Packs/day: 0.50    Years: 10.00    Pack years: 5.00    Types:  Cigarettes    Quit date: 75    Years since quitting: 27.7   Smokeless tobacco: Never  Vaping Use   Vaping Use: Never used  Substance and Sexual Activity   Alcohol use: No   Drug use: No   Sexual activity: Yes    Birth control/protection: None  Other Topics Concern   Not on file  Social History Narrative   Not on file   Social Determinants of Health   Financial Resource Strain: Not on file  Food Insecurity: Not on file  Transportation Needs: Not on file  Physical Activity: Not on file  Stress: Not on file  Social Connections: Not on file  Intimate Partner Violence: Not on file   Family History  Problem Relation Age of Onset   Cancer Mother 34       breast   Hypothyroidism Mother    Heart murmur Mother    Heart murmur Daughter    Bipolar disorder Daughter    Autism Daughter    Allergic Disorder Daughter    Asthma Daughter    Dementia Maternal Grandmother    Atrial fibrillation Maternal Grandmother    Thyroid disease Maternal Grandmother    Heart disease Maternal Grandfather    Heart attack Maternal Grandfather    Allergic Disorder Daughter  Objective: Office vital signs reviewed. BP 113/72   Pulse 80   Temp 98.4 F (36.9 C)   Ht 5\' 2"  (1.575 m)   Wt 143 lb 9.6 oz (65.1 kg)   SpO2 99%   BMI 26.26 kg/m   Physical Examination:  General: Awake, alert, well nourished, No acute distress GU: external vaginal tissue normal, cervix to patient's right, no punctate lesions on cervix appreciated, moderate white and scantly bloody discharge from cervical os Psych: Mood stable, speech normal  Depression screen [facility] 2/9 02/28/2021 01/04/2021 05/17/2020  Decreased Interest 2 2 1   Down, Depressed, Hopeless 1 1 0  PHQ - 2 Score 3 3 1   Altered sleeping 3 1 -  Tired, decreased energy 2 2 -  Change in appetite 0 2 -  Feeling bad or failure about yourself  0 0 -  Trouble concentrating 2 2 -  Moving slowly or fidgety/restless 0 0 -  Suicidal thoughts 0 0 -  PHQ-9  Score 10 10 -  Difficult doing work/chores Somewhat difficult Somewhat difficult -   GAD 7 : Generalized Anxiety Score 02/28/2021 01/04/2021 12/06/2019  Nervous, Anxious, on Edge 2 2 3   Control/stop worrying 1 3 2   Worry too much - different things 1 2 2   Trouble relaxing 3 3 3   Restless 2 0 3  Easily annoyed or irritable 3 2 2   Afraid - awful might happen 0 0 0  Total GAD 7 Score 12 12 15   Anxiety Difficulty Somewhat difficult Somewhat difficult Somewhat difficult      Assessment/ Plan: 45 y.o. female   Irregular menses  Screening for cervical cancer - Plan: Cytology - PAP  GAD (generalized anxiety disorder) - Plan: busPIRone (BUSPAR) 5 MG tablet, citalopram (CELEXA) 20 MG tablet  Panic attack - Plan: busPIRone (BUSPAR) 5 MG tablet, citalopram (CELEXA) 20 MG tablet  Menstrual cycles have gotten back to normal after use of Provera.  She should be coming up on a period soon.  Cervical cancer screening obtained today with HPV cotesting.  She declined any STD testing  Anxiety disorder is stable with BuSpar and Celexa.  Did not ask for increased dose of BuSpar.  Though her PHQ and gad are elevated and she may benefit from this.  Current Meds have been renewed per her request.  No orders of the defined types were placed in this encounter.  No orders of the defined types were placed in this encounter.    01/06/2021, DO [facility] [phone]

## 2021-03-01 ENCOUNTER — Other Ambulatory Visit: Payer: Self-pay | Admitting: Family Medicine

## 2021-03-01 DIAGNOSIS — Z1231 Encounter for screening mammogram for malignant neoplasm of breast: Secondary | ICD-10-CM

## 2021-03-06 LAB — CYTOLOGY - PAP
Comment: NEGATIVE
Diagnosis: NEGATIVE
High risk HPV: NEGATIVE

## 2021-03-09 NOTE — Progress Notes (Signed)
Pt aware.

## 2021-03-14 ENCOUNTER — Ambulatory Visit
Admission: RE | Admit: 2021-03-14 | Discharge: 2021-03-14 | Disposition: A | Discharge status: Home / Self Care | Payer: Self-pay | Source: Ambulatory Visit | Attending: Family Medicine | Admitting: Family Medicine

## 2021-03-14 ENCOUNTER — Other Ambulatory Visit: Payer: Self-pay

## 2021-03-14 DIAGNOSIS — Z1231 Encounter for screening mammogram for malignant neoplasm of breast: Secondary | ICD-10-CM

## 2021-03-21 ENCOUNTER — Other Ambulatory Visit: Payer: Self-pay | Admitting: Family Medicine

## 2021-03-21 ENCOUNTER — Telehealth: Payer: Self-pay | Admitting: Family Medicine

## 2021-03-21 ENCOUNTER — Other Ambulatory Visit (HOSPITAL_COMMUNITY): Payer: Self-pay | Admitting: Family Medicine

## 2021-03-21 DIAGNOSIS — R928 Other abnormal and inconclusive findings on diagnostic imaging of breast: Secondary | ICD-10-CM

## 2021-03-21 DIAGNOSIS — F410 Panic disorder [episodic paroxysmal anxiety] without agoraphobia: Secondary | ICD-10-CM

## 2021-03-21 DIAGNOSIS — F411 Generalized anxiety disorder: Secondary | ICD-10-CM

## 2021-03-21 NOTE — Telephone Encounter (Signed)
I would like her to proceed with follow up imaging and biopsy if it is indicated.  Lori Koch should be contacting her with instructions on follow up imaging.

## 2021-03-21 NOTE — Telephone Encounter (Signed)
Spoke to patient about mammo results and what to expect from the additional imag and why it is important

## 2021-04-03 ENCOUNTER — Other Ambulatory Visit: Payer: Self-pay

## 2021-04-03 ENCOUNTER — Ambulatory Visit (HOSPITAL_COMMUNITY)
Admission: RE | Admit: 2021-04-03 | Discharge: 2021-04-03 | Disposition: A | Discharge status: Home / Self Care | Payer: Self-pay | Source: Ambulatory Visit | Attending: Family Medicine | Admitting: Family Medicine

## 2021-04-03 DIAGNOSIS — R928 Other abnormal and inconclusive findings on diagnostic imaging of breast: Secondary | ICD-10-CM | POA: Insufficient documentation

## 2021-04-04 ENCOUNTER — Other Ambulatory Visit (HOSPITAL_COMMUNITY): Payer: Self-pay | Admitting: Family Medicine

## 2021-04-04 DIAGNOSIS — R928 Other abnormal and inconclusive findings on diagnostic imaging of breast: Secondary | ICD-10-CM

## 2021-05-02 ENCOUNTER — Ambulatory Visit (INDEPENDENT_AMBULATORY_CARE_PROVIDER_SITE_OTHER): Payer: Self-pay | Admitting: Family Medicine

## 2021-05-02 ENCOUNTER — Encounter: Payer: Self-pay | Admitting: Family Medicine

## 2021-05-02 DIAGNOSIS — U071 COVID-19: Secondary | ICD-10-CM

## 2021-05-02 DIAGNOSIS — J069 Acute upper respiratory infection, unspecified: Secondary | ICD-10-CM

## 2021-05-02 NOTE — Progress Notes (Signed)
Virtual Visit via telephone Note Due to COVID-19 pandemic this visit was conducted virtually. This visit type was conducted due to national recommendations for restrictions regarding the COVID-19 Pandemic (e.g. social distancing, sheltering in place) in an effort to limit this patient's exposure and mitigate transmission in our community. All issues noted in this document were discussed and addressed.  A physical exam was not performed with this format.   I connected with Lori Koch on 05/02/2021 at 0831 by telephone and verified that I am speaking with the correct person using two identifiers. Lori Koch is currently located at home and family is currently with them during visit. The provider, Kari Baars, FNP is located in their office at time of visit.  I discussed the limitations, risks, security and privacy concerns of performing an evaluation and management service by telephone and the availability of in person appointments. I also discussed with the patient that there may be a patient responsible charge related to this service. The patient expressed understanding and agreed to proceed.  Subjective:  Patient ID: Lori Koch, female    DOB: [birth year], 45 y.o.   MRN: [id number]  Chief Complaint:  Influenza and URI   HPI: Lori Koch is a 45 y.o. female presenting on 05/02/2021 for Influenza and URI   Pt reports cough, congestion, myalgias, fever, chills, headache, and malaise since last night. States she is on Rinvoq for RA and is concerned that her symptoms may worsen.   Influenza This is a new problem. The current episode started yesterday. The problem occurs constantly. Associated symptoms include arthralgias, chills, congestion, coughing, fatigue, a fever, headaches, myalgias and a sore throat. Pertinent negatives include no abdominal pain, anorexia, change in bowel habit, chest pain, diaphoresis, joint swelling, nausea, neck pain, numbness, rash, swollen glands,  urinary symptoms, vertigo, visual change, vomiting or weakness. She has tried acetaminophen for the symptoms. The treatment provided no relief.  URI  This is a new problem. The current episode started yesterday. The maximum temperature recorded prior to her arrival was 100.4 - 100.9 F. The fever has been present for 1 to 2 days. Associated symptoms include congestion, coughing, headaches, rhinorrhea and a sore throat. Pertinent negatives include no abdominal pain, chest pain, diarrhea, dysuria, ear pain, joint pain, joint swelling, nausea, neck pain, plugged ear sensation, rash, sinus pain, sneezing, swollen glands, vomiting or wheezing. She has tried acetaminophen for the symptoms. The treatment provided no relief.    Relevant past medical, surgical, family, and social history reviewed and updated as indicated.  Allergies and medications reviewed and updated.   Past Medical History:  Diagnosis Date   Diabetes mellitus without complication (HCC) 2019   diet only   Thyroid disease    hypothyroidism    Past Surgical History:  Procedure Laterality Date   TUBAL LIGATION  1998    Social History   Socioeconomic History   Marital status: Married    Spouse name: Not on file   Number of children: Not on file   Years of education: Not on file   Highest education level: Not on file  Occupational History   Not on file  Tobacco Use   Smoking status: Former    Packs/day: 0.50    Years: 10.00    Pack years: 5.00    Types: Cigarettes    Quit date: 74    Years since quitting: 27.8   Smokeless tobacco: Never  Vaping Use   Vaping Use: Never used  Substance  and Sexual Activity   Alcohol use: No   Drug use: No   Sexual activity: Yes    Birth control/protection: None  Other Topics Concern   Not on file  Social History Narrative   Not on file   Social Determinants of Health   Financial Resource Strain: Not on file  Food Insecurity: Not on file  Transportation Needs: Not on file   Physical Activity: Not on file  Stress: Not on file  Social Connections: Not on file  Intimate Partner Violence: Not on file    Outpatient Encounter Medications as of 05/02/2021  Medication Sig   busPIRone (BUSPAR) 5 MG tablet TAKE 1 TABLET BY MOUTH 3 TIMES DAILY AS NEEDED.   citalopram (CELEXA) 20 MG tablet Take 1 tablet (20 mg total) by mouth daily.   levothyroxine (SYNTHROID) 25 MCG tablet TAKE 1.5 TABLETS (37.5 MCG TOTAL) BY MOUTH DAILY BEFORE BREAKFAST.   Upadacitinib ER (RINVOQ) 15 MG TB24 Take 1 tablet by mouth daily in the afternoon.   No facility-administered encounter medications on file as of 05/02/2021.    Allergies  Allergen Reactions   Sulfa Antibiotics Hives    Review of Systems  Constitutional:  Positive for activity change, appetite change, chills, fatigue and fever. Negative for diaphoresis and unexpected weight change.  HENT:  Positive for congestion, postnasal drip, rhinorrhea and sore throat. Negative for dental problem, drooling, ear discharge, ear pain, facial swelling, hearing loss, mouth sores, nosebleeds, sinus pressure, sinus pain, sneezing, trouble swallowing and voice change.   Respiratory:  Positive for cough. Negative for wheezing.   Cardiovascular:  Negative for chest pain.  Gastrointestinal:  Negative for abdominal pain, anorexia, change in bowel habit, diarrhea, nausea and vomiting.  Genitourinary:  Negative for decreased urine volume, difficulty urinating and dysuria.  Musculoskeletal:  Positive for arthralgias and myalgias. Negative for joint pain, joint swelling and neck pain.  Skin:  Negative for rash.  Neurological:  Positive for headaches. Negative for dizziness, vertigo, tremors, seizures, syncope, facial asymmetry, speech difficulty, weakness, light-headedness and numbness.  All other systems reviewed and are negative.       Observations/Objective: No vital signs or physical exam, this was a telephone or virtual health encounter.  Pt  alert and oriented, answers all questions appropriately, and able to speak in full sentences.    Assessment and Plan: Lori Koch was seen today for influenza and uri.  Diagnoses and all orders for this visit:  URI with cough and congestion Symptoms consistent with viral URI. Will test for below and initiate antiviral therapy if indicated. Pt aware of symptomatic care at home. Report any new, worsening, or persistent symptoms.  -     COVID-19, Flu A+B and RSV     Follow Up Instructions: Return if symptoms worsen or fail to improve.    I discussed the assessment and treatment plan with the patient. The patient was provided an opportunity to ask questions and all were answered. The patient agreed with the plan and demonstrated an understanding of the instructions.   The patient was advised to call back or seek an in-person evaluation if the symptoms worsen or if the condition fails to improve as anticipated.  The above assessment and management plan was discussed with the patient. The patient verbalized understanding of and has agreed to the management plan. Patient is aware to call the clinic if they develop any new symptoms or if symptoms persist or worsen. Patient is aware when to return to the clinic for a follow-up  visit. Patient educated on when it is appropriate to go to the emergency department.    I provided 12 minutes of non-face-to-face time during this encounter. The call started at 0831. The call ended at [phone]. The other time was used for coordination of care.    Kari Baars, FNP-C [facility] [address] [place], [place] [address] [phone] 05/02/2021

## 2021-05-03 ENCOUNTER — Encounter: Payer: Self-pay | Admitting: Family Medicine

## 2021-05-03 LAB — COVID-19, FLU A+B AND RSV
Influenza A, NAA: NOT DETECTED
Influenza B, NAA: NOT DETECTED
RSV, NAA: NOT DETECTED
SARS-CoV-2, NAA: DETECTED — AB

## 2021-05-03 MED ORDER — MOLNUPIRAVIR EUA 200MG CAPSULE: 4.0000 | ORAL_CAPSULE | Freq: Two times a day (BID) | ORAL | 0 refills | Status: AC

## 2021-05-03 NOTE — Addendum Note (Signed)
Addended by: Sonny Masters on: 05/03/2021 03:27 PM   Modules accepted: Orders

## 2021-05-22 ENCOUNTER — Ambulatory Visit: Payer: Self-pay | Admitting: "Endocrinology

## 2021-05-24 ENCOUNTER — Other Ambulatory Visit: Payer: Self-pay

## 2021-06-19 ENCOUNTER — Other Ambulatory Visit: Payer: Self-pay

## 2021-06-19 DIAGNOSIS — E039 Hypothyroidism, unspecified: Secondary | ICD-10-CM

## 2021-06-20 LAB — TSH: TSH: 1.95 u[IU]/mL (ref 0.450–4.500)

## 2021-06-20 LAB — T4, FREE: Free T4: 1.2 ng/dL (ref 0.82–1.77)

## 2021-06-26 ENCOUNTER — Other Ambulatory Visit: Payer: Self-pay

## 2021-06-26 ENCOUNTER — Encounter: Payer: Self-pay | Admitting: "Endocrinology

## 2021-06-26 ENCOUNTER — Ambulatory Visit (INDEPENDENT_AMBULATORY_CARE_PROVIDER_SITE_OTHER): Payer: Self-pay | Admitting: "Endocrinology

## 2021-06-26 VITALS — BP 116/76 | HR 64 | Ht 62.5 in | Wt 140.0 lb

## 2021-06-26 DIAGNOSIS — E042 Nontoxic multinodular goiter: Secondary | ICD-10-CM

## 2021-06-26 DIAGNOSIS — E039 Hypothyroidism, unspecified: Secondary | ICD-10-CM

## 2021-06-26 NOTE — Progress Notes (Signed)
06/26/2021        Endocrinology follow-up note    Subjective:    Patient ID: Lori Koch, female    DOB: 08/20/1975, PCP Janora Norlander, DO   Past Medical History:  Diagnosis Date   Diabetes mellitus without complication ([place]) [id number]   diet only   Thyroid disease    hypothyroidism   Past Surgical History:  Procedure Laterality Date   TUBAL LIGATION  1998   Social History   Socioeconomic History   Marital status: Married    Spouse name: Not on file   Number of children: Not on file   Years of education: Not on file   Highest education level: Not on file  Occupational History   Not on file  Tobacco Use   Smoking status: Former    Packs/day: 0.50    Years: 10.00    Pack years: 5.00    Types: Cigarettes    Quit date: 38    Years since quitting: 28.0   Smokeless tobacco: Never  Vaping Use   Vaping Use: Never used  Substance and Sexual Activity   Alcohol use: No   Drug use: No   Sexual activity: Yes    Birth control/protection: None  Other Topics Concern   Not on file  Social History Narrative   Not on file   Social Determinants of Health   Financial Resource Strain: Not on file  Food Insecurity: Not on file  Transportation Needs: Not on file  Physical Activity: Not on file  Stress: Not on file  Social Connections: Not on file   Outpatient Encounter Medications as of 06/26/2021  Medication Sig   busPIRone (BUSPAR) 5 MG tablet TAKE 1 TABLET BY MOUTH 3 TIMES DAILY AS NEEDED.   citalopram (CELEXA) 20 MG tablet Take 1 tablet (20 mg total) by mouth daily.   levothyroxine (SYNTHROID) 25 MCG tablet TAKE 1.5 TABLETS (37.5 MCG TOTAL) BY MOUTH DAILY BEFORE BREAKFAST.   Upadacitinib ER (RINVOQ) 15 MG TB24 Take 1 tablet by mouth daily in the afternoon.   No facility-administered encounter medications on file as of 06/26/2021.   ALLERGIES: Allergies  Allergen Reactions   Sulfa Antibiotics Hives    VACCINATION  STATUS: Immunization History  Administered Date(s) Administered   Influenza Inj Mdck Quad Pf 06/04/2018   Influenza,inj,Quad PF,6+ Mos 06/04/2017, 04/19/2019   Influenza-Unspecified 06/04/2018   PFIZER Comirnaty(Gray Top)Covid-19 Tri-Sucrose Vaccine 08/02/2020   PPD Test 03/22/2019    HPI Lori Koch is 46 y.o. female who presents today with a medical history as above. she is being seen in follow-up for hypothyroidism, multinodular goiter.  -She is on stable dose of levothyroxine 37.5 mg p.o. daily before breakfast.  She continues to feel better, reports compliance to her medication.    -She has no new complaints today.   - She says she was diagnosed with hypothyroidism in 2011.  - She is also known to have nodular goiter, previsit thyroid ultrasound is significant for similar findings compared to prior studies.  Previous  thyroid ultrasound shows overall decrease in size of bilateral thyroid lobes and decrease in size of thyroid nodule in the right lobe to 1.5 cm from 1.7 cm.  This nodule had no suspicious features.  Her previsit thyroid ultrasound documents similar findings. -In the interim, she was diagnosed with rheumatoid arthritis which required brief intervention with steroids which caused unintended weight gain of 5 pounds.      She has  diet controlled type 2 diabetes with a reported A1c of 4.5%. -She denies palpitations, hot/cold intolerance. She denies dysphagia, shortness of breath, nor voice change. -She denies exposure to neck radiation. - She is a former smoker. - She has 3 grown kids status post tubal ligation in 1998.  Review of Systems Limited as above.  Objective:    BP 116/76    Pulse 64    Ht 5' 2.5" (1.588 m)    Wt 140 lb (63.5 kg)    BMI 25.20 kg/m   Wt Readings from Last 3 Encounters:  06/26/21 140 lb (63.5 kg)  02/28/21 143 lb 9.6 oz (65.1 kg)  01/04/21 150 lb 9.6 oz (68.3 kg)    Physical Exam    Physical Exam- Limited  Constitutional:  Body  mass index is 25.2 kg/m. , not in acute distress, normal state of mind   TSH from 03/10/2017 was 1.85 Recent Results (from the past 2160 hour(s))  COVID-19, Flu A+B and RSV     Status: Abnormal   Collection Time: 05/02/21  9:18 AM   Specimen: Nasopharyngeal(NP) swabs in vial transport medium  Result Value Ref Range   SARS-CoV-2, NAA Detected (A) Not Detected    Comment: Patients who have a positive COVID-19 test result may now have treatment options. Treatment options are available for patients with mild to moderate symptoms and for hospitalized patients. Visit our website at [email or web address] for resources and information.    Influenza A, NAA Not Detected Not Detected   Influenza B, NAA Not Detected Not Detected   RSV, NAA Not Detected Not Detected   Test Information: Comment     Comment: This nucleic acid amplification test was developed and its performance characteristics determined by [employer]. Nucleic acid amplification tests include RT-PCR and TMA. This test has not been FDA cleared or approved. This test has been authorized by FDA under an Emergency Use Authorization (EUA). This test is only authorized for the duration of time the declaration that circumstances exist justifying the authorization of the emergency use of in vitro diagnostic tests for detection of SARS-CoV-2 virus and/or diagnosis of COVID-19 infection under section 564(b)(1) of the Act, 21 U.S.C. PT:2852782) (1), unless the authorization is terminated or revoked sooner. When diagnostic testing is negative, the possibility of a false negative result should be considered in the context of a patient's recent exposures and the presence of clinical signs and symptoms consistent with COVID-19. An individual without symptoms of COVID-19 and who is not shedding SARS-CoV-2 virus wo uld expect to have a negative (not detected) result in this assay.   TSH     Status: None   Collection Time:  06/19/21  1:14 PM  Result Value Ref Range   TSH 1.950 0.450 - 4.500 uIU/mL  T4, Free     Status: None   Collection Time: 06/19/21  1:14 PM  Result Value Ref Range   Free T4 1.20 0.82 - 1.77 ng/dL   Thyroid ultrasound from 04/25/2017 showed right lobe 4.6 cm with 1.5 cm nodule; left lobe 3.3 cm with no nodules.   Thyroid ultrasound on May 04, 2018: Right lobe decrease in size to 3.6 cm from four-point recliner, left lobe decreased to 3.1 cm from 3.3 cm.  1.5 cm nodule on the right lobe, previously 1.7 cm.  Thyroid ultrasound on May 17, 2019 -Confirm 2-year stability of 1.6 cm category 3 nodule in the right medial gland.  Recommend continued follow-up every 1-2 years until 5-year stability has been  confirmed.   Thyroid ultrasound on Nov 10, 2020:  IMPRESSION: 1. Nodule 1 located in the mid right thyroid lobe is not significantly changed in size since 04/25/2017. Follow-up ultrasound should be performed to document 5 years of stability. 2. Nodule 3 located in the left inferior thyroid lobe is not significantly changed in size since 05/17/2019. This meets criteria for surveillance. Follow-up thyroid ultrasound should be performed in 1 year.  Assessment & Plan:   1. Hypothyroidism 2. Thyroid nodule -Her previsit thyroid function tests are consistent with appropriate replacement and she is advised to continue  levothyroxine 37.5 mcg p.o. daily before breakfast.   - We discussed about the correct intake of her thyroid hormone, on empty stomach at fasting, with water, separated by at least 30 minutes from breakfast and other medications,  and separated by more than 4 hours from calcium, iron, multivitamins, acid reflux medications (PPIs). -Patient is made aware of the fact that thyroid hormone replacement is needed for life, dose to be adjusted by periodic monitoring of thyroid function tests.   - Regarding 1.5 cm nodule on the right lobe and 0.9 cm nodule in the left lobe  with no suspicious features, she will not need any intervention at this time.  She will be considered for 1 more surveillance ultrasound in 1 to 2 years.    - I advised patient to maintain close follow up with Janora Norlander, DO for primary care needs.   I spent  21 minutes in the care of the patient today including review of labs from Thyroid Function, CMP, and other relevant labs ; imaging/biopsy records (current and previous including abstractions from other facilities); face-to-face time discussing  her lab results and symptoms, medications doses, her options of short and long term treatment based on the latest standards of care / guidelines;   and documenting the encounter.  Lori Koch  participated in the discussions, expressed understanding, and voiced agreement with the above plans.  All questions were answered to her satisfaction. she is encouraged to contact clinic should she have any questions or concerns prior to her return visit.   Follow up plan: Return in about 6 months (around 12/24/2021) for F/U with Pre-visit Labs.  Glade Lloyd, MD [facility] Phone: [phone]  Fax: [phone]   06/26/2021, 11:54 AM This note was partially dictated with voice recognition software. Similar sounding words can be transcribed inadequately or may not  be corrected upon review.

## 2021-07-10 ENCOUNTER — Other Ambulatory Visit: Payer: Self-pay

## 2021-07-10 ENCOUNTER — Ambulatory Visit (HOSPITAL_COMMUNITY)
Admission: RE | Admit: 2021-07-10 | Discharge: 2021-07-10 | Disposition: A | Discharge status: Home / Self Care | Payer: Self-pay | Source: Ambulatory Visit | Attending: Family Medicine | Admitting: Family Medicine

## 2021-07-10 DIAGNOSIS — R928 Other abnormal and inconclusive findings on diagnostic imaging of breast: Secondary | ICD-10-CM | POA: Insufficient documentation

## 2021-07-11 ENCOUNTER — Telehealth: Payer: Self-pay | Admitting: *Deleted

## 2021-07-11 ENCOUNTER — Other Ambulatory Visit (HOSPITAL_COMMUNITY): Payer: Self-pay | Admitting: Family Medicine

## 2021-07-11 ENCOUNTER — Other Ambulatory Visit: Payer: Self-pay | Admitting: Family Medicine

## 2021-07-11 NOTE — Telephone Encounter (Signed)
No one answered when I called.  I left a VM.  Is there a standard form that I can fax to place a referral.  I would prefer not to delay her care

## 2021-07-11 NOTE — Telephone Encounter (Signed)
Mary with [facility] called and states that this pt had a diagnostic mammo yesterday and is now needing further testing and the patient has no insurance.  She recommended to the pt that we can try to get her in the [facility] program for this and if she is accepted in the program her testing would be 100% covered.   The referral would need to come from her PCP and the phone number for the local [facility] program is [phone] [facility] does not have one anymore)   Can this referral be placed?   Pt is ok waiting for this to process, per Corrie Dandy - she reached out to her.

## 2021-07-11 NOTE — Telephone Encounter (Signed)
I was ultimately contacted by [facility] at the number provided.  She will arrange evaluation for Ms. Lori Koch ASAP given recent findings on mammography and ultrasound.  Her scheduler, Gunnar Fusi, will contact the patient first thing in the morning  Voiced appreciation for her assistance.

## 2021-07-12 ENCOUNTER — Other Ambulatory Visit: Payer: Self-pay | Admitting: Family Medicine

## 2021-07-12 DIAGNOSIS — N6320 Unspecified lump in the left breast, unspecified quadrant: Secondary | ICD-10-CM

## 2021-07-26 ENCOUNTER — Ambulatory Visit: Payer: Self-pay | Admitting: *Deleted

## 2021-07-26 ENCOUNTER — Other Ambulatory Visit: Payer: Self-pay

## 2021-07-26 VITALS — BP 124/72 | Wt 140.9 lb

## 2021-07-26 DIAGNOSIS — Z1239 Encounter for other screening for malignant neoplasm of breast: Secondary | ICD-10-CM

## 2021-07-26 DIAGNOSIS — Z1211 Encounter for screening for malignant neoplasm of colon: Secondary | ICD-10-CM

## 2021-07-26 DIAGNOSIS — N644 Mastodynia: Secondary | ICD-10-CM

## 2021-07-26 NOTE — Progress Notes (Signed)
Lori Koch is a 46 y.o. female who presents to [facility] clinic today with complaint of bilateral upper and outer breast pain since she was 46 years old that comes and goes. Patient states the pain is worse during her menstrual cycle. Patient rates the pain at a 5 out of 10.   Patient referred to [facility]CP due to patient had a screening mammogram completed 03/14/2021 that additional imaging of the left breast was recommended for follow up. Patient had a left breast diagnostic mammogram and ultrasound completed for follow up 04/03/2021 that recommended 3 month follow up. Patent followed up 07/10/2021 and a left breast stereo biopsy is recommended for follow up.   Pap Smear: Pap smear not completed today. Last Pap smear was 02/28/2021 at [facility] clinic and was normal with negative HPV. Per patient has no history of an abnormal Pap smear. Last Pap smear result is available in Epic.   Physical exam: Breasts Left breast is larger than right breast that per patient is normal for her. No skin abnormalities bilateral breasts. No nipple retraction bilateral breasts. No nipple discharge bilateral breasts. No lymphadenopathy. No lumps palpated bilateral breasts. Complaints of bilateral diffuse breast pain on exam.      MM DIAG BREAST TOMO UNI LEFT  Result Date: 07/10/2021 CLINICAL DATA:  Patient returns for short interval follow-up of probably benign mass in the LEFT breast. EXAM: DIGITAL DIAGNOSTIC UNILATERAL LEFT MAMMOGRAM WITH TOMOSYNTHESIS AND CAD; ULTRASOUND LEFT BREAST LIMITED TECHNIQUE: Left digital diagnostic mammography and breast tomosynthesis was performed. The images were evaluated with computer-aided detection.; Targeted ultrasound examination of the left breast was performed. COMPARISON:  Previous exam(s). ACR Breast Density Category b: There are scattered areas of fibroglandular density. FINDINGS: There is persistent 1.3 centimeter mass with indistinct margins in the UPPER  INNER QUADRANT of the LEFT breast, confirmed on spot compression views. On physical exam, I palpate no abnormality in the UPPER-OUTER QUADRANT of the LEFT breast. There is no visible ecchymosis. Targeted ultrasound is performed, showing focal hyperechoic band of tissue in the 11:30 o'clock location of the LEFT breast 10 centimeters from the nipple without suspicious features. This area appears slightly smaller compared with prior study, and was previously labeled 11:30 o'clock location 6 centimeters from the nipple. BB is placed over this abnormality and follow-up spot tangential view is performed, showing that the sonographic abnormality and mammographic abnormality are discrete. LEFT axilla is negative for adenopathy. IMPRESSION: 1. Indeterminate mass in the UPPER INNER QUADRANT of the LEFT breast without definite sonographic correlate. 2. Hyperechoic tissue in the 11:30 o'clock location of the LEFT breast 10 centimeters from the nipple, benign in appearance. 3. No LEFT axillary adenopathy. RECOMMENDATION: Stereotactic biopsy of LEFT breast mass I have discussed the findings and recommendations with the patient. If applicable, a reminder letter will be sent to the patient regarding the next appointment. BI-RADS CATEGORY  4: Suspicious. Electronically Signed   By: Norva Pavlov M.D.   On: 07/10/2021 15:45  MM DIAG BREAST TOMO UNI LEFT  Result Date: 04/03/2021 CLINICAL DATA:  Screening recall for possible left breast mass. EXAM: DIGITAL DIAGNOSTIC UNILATERAL LEFT MAMMOGRAM WITH TOMOSYNTHESIS AND CAD; ULTRASOUND LEFT BREAST LIMITED TECHNIQUE: Left digital diagnostic mammography and breast tomosynthesis was performed. The images were evaluated with computer-aided detection.; Targeted ultrasound examination of the left breast was performed. COMPARISON:  Previous exams. ACR Breast Density Category b: There are scattered areas of fibroglandular density. FINDINGS: Additional tomograms were performed of the left  breast. There  is a persistent oval circumscribed mass in the upper slightly inner left breast which on the additional spot compression images appears low density and spreads out on the additional spot compression images. Physical examination of the upper left breast does not reveal any palpable masses. The patient states she has recently experienced bruising on her upper left breast after bending down in reaching into the washing machine to remove clothing. Targeted ultrasound of the left breast was performed. There are several areas of scattered fibrocystic change identified in the upper left breast. There is a hyperechoic mass/area containing tiny cysts at the 11:30 position 6 cm from nipple measuring 1 x 0.6 x 1.2 cm and demonstrating imaging features suggestive of an area of fat necrosis. This may correspond with the possible mass seen in the left breast at mammography. IMPRESSION: Probably benign left breast mass possibly representing an area of fat necrosis. RECOMMENDATION: Recommend the patient return for diagnostic mammography and ultrasound of the left breast in 3 months. If this area has not resolved/appears unchanged, then biopsy would be warranted. I have discussed the findings and recommendations with the patient. If applicable, a reminder letter will be sent to the patient regarding the next appointment. BI-RADS CATEGORY  3: Probably benign. Electronically Signed   By: Edwin Cap M.D.   On: 04/03/2021 15:16  MM 3D SCREEN BREAST BILATERAL  Result Date: 03/21/2021 CLINICAL DATA:  Screening. EXAM: DIGITAL SCREENING BILATERAL MAMMOGRAM WITH TOMOSYNTHESIS AND CAD TECHNIQUE: Bilateral screening digital craniocaudal and mediolateral oblique mammograms were obtained. Bilateral screening digital breast tomosynthesis was performed. The images were evaluated with computer-aided detection. COMPARISON:  Previous exam(s). ACR Breast Density Category b: There are scattered areas of fibroglandular density.  FINDINGS: In the left breast, a possible mass warrants further evaluation. In the right breast, no findings suspicious for malignancy. IMPRESSION: Further evaluation is suggested for a possible mass in the left breast. RECOMMENDATION: Diagnostic mammogram and possibly ultrasound of the left breast. (Code:[id number]) The patient will be contacted regarding the findings, and additional imaging will be scheduled. BI-RADS CATEGORY  0: Incomplete. Need additional imaging evaluation and/or prior mammograms for comparison. Electronically Signed   By: Frederico Hamman M.D.   On: 03/21/2021 06:51    Pelvic/Bimanual Pap is not indicated today per BCCCP guidelines.    Smoking History: Patient is a former smoker that quit in 1995.   Patient Navigation: Patient education provided. Access to services provided for patient through B[facility]CP program.   Colorectal Cancer Screening: Per patient has never had colonoscopy completed. FIT Test given to patient to complete. No complaints today.    Breast and Cervical Cancer Risk Assessment: Patient has family history of her mother and paternal grandmother having breast cancer. Patient has no known genetic mutations or history of radiation treatment to the chest before age 89. Patient does not have history of cervical dysplasia, immunocompromised, or DES exposure in-utero.  Risk Assessment     Risk Scores       07/26/2021   Last edited by: Narda Rutherford, LPN   5-year risk: 1.8 %   Lifetime risk: 18.2 %            A: B[facility]CP exam without pap smear Complaint of bilateral breast pain.  P: Referred patient to [facility] for a left breast biopsy per recommendation. Appointment scheduled Friday, July 27, 2021 at 1030.  Priscille Heidelberg, RN 07/26/2021 8:34 AM

## 2021-07-26 NOTE — Patient Instructions (Signed)
Explained breast self awareness with [name]. Patient did not need a Pap smear today due to last Pap smear and HPV typing was 02/28/2021. Let her know [facility]CP will cover Pap smears and HPV typing every 5 years unless has a history of abnormal Pap smears. Referred patient to the [facility] for a left breast biopsy per recommendation. Appointment scheduled Friday, July 27, 2021 at 1030. Patient aware of appointment and will be there. [name] verbalized understanding.  Cormick Moss, Arvil Chaco, RN 8:34 AM

## 2021-07-27 ENCOUNTER — Ambulatory Visit
Admission: RE | Admit: 2021-07-27 | Discharge: 2021-07-27 | Disposition: A | Discharge status: Home / Self Care | Payer: No Typology Code available for payment source | Source: Ambulatory Visit | Attending: Family Medicine | Admitting: Family Medicine

## 2021-07-27 DIAGNOSIS — N6320 Unspecified lump in the left breast, unspecified quadrant: Secondary | ICD-10-CM

## 2021-07-30 HISTORY — PX: BREAST BIOPSY: SHX20

## 2021-08-03 LAB — FECAL OCCULT BLOOD, IMMUNOCHEMICAL: Fecal Occult Bld: NEGATIVE

## 2021-08-08 ENCOUNTER — Telehealth: Payer: Self-pay

## 2021-08-08 NOTE — Telephone Encounter (Signed)
Patient informed negative FIT Test results. Patient verbalized understanding.  

## 2021-10-25 ENCOUNTER — Other Ambulatory Visit: Payer: Self-pay | Admitting: "Endocrinology

## 2021-11-02 ENCOUNTER — Other Ambulatory Visit: Payer: Self-pay

## 2021-12-19 ENCOUNTER — Other Ambulatory Visit: Payer: Self-pay

## 2021-12-19 DIAGNOSIS — Z1231 Encounter for screening mammogram for malignant neoplasm of breast: Secondary | ICD-10-CM

## 2021-12-24 ENCOUNTER — Ambulatory Visit: Payer: Self-pay | Admitting: "Endocrinology

## 2021-12-28 ENCOUNTER — Other Ambulatory Visit: Payer: Self-pay

## 2021-12-29 LAB — T4, FREE: Free T4: 1.38 ng/dL (ref 0.82–1.77)

## 2021-12-29 LAB — TSH: TSH: 1.84 u[IU]/mL (ref 0.450–4.500)

## 2022-01-02 ENCOUNTER — Encounter: Payer: Self-pay | Admitting: "Endocrinology

## 2022-01-02 ENCOUNTER — Ambulatory Visit (INDEPENDENT_AMBULATORY_CARE_PROVIDER_SITE_OTHER): Payer: Self-pay | Admitting: "Endocrinology

## 2022-01-02 VITALS — BP 98/62 | HR 64 | Ht 62.5 in | Wt 139.2 lb

## 2022-01-02 DIAGNOSIS — E039 Hypothyroidism, unspecified: Secondary | ICD-10-CM

## 2022-01-02 NOTE — Progress Notes (Signed)
01/02/2022        Endocrinology follow-up note    Subjective:    Patient ID: Lori Koch, female    DOB: Aug 10, 1975, PCP Lori Norlander, DO   Past Medical History:  Diagnosis Date   Diabetes mellitus without complication ([place]) [id number]   diet only   Thyroid disease    hypothyroidism   Past Surgical History:  Procedure Laterality Date   TUBAL LIGATION  1998   Social History   Socioeconomic History   Marital status: Married    Spouse name: Not on file   Number of children: 3   Years of education: Not on file   Highest education level: GED or equivalent  Occupational History   Not on file  Tobacco Use   Smoking status: Former    Packs/day: 0.50    Years: 10.00    Total pack years: 5.00    Types: Cigarettes    Quit date: 1995    Years since quitting: 28.5   Smokeless tobacco: Never  Vaping Use   Vaping Use: Never used  Substance and Sexual Activity   Alcohol use: No   Drug use: No   Sexual activity: Yes    Birth control/protection: None  Other Topics Concern   Not on file  Social History Narrative   Not on file   Social Determinants of Health   Financial Resource Strain: Not on file  Food Insecurity: No Food Insecurity (07/26/2021)   Hunger Vital Sign    Worried About Running Out of Food in the Last Year: Never true    Ran Out of Food in the Last Year: Never true  Transportation Needs: No Transportation Needs (07/26/2021)   PRAPARE - Hydrologist (Medical): No    Lack of Transportation (Non-Medical): No  Physical Activity: Not on file  Stress: Not on file  Social Connections: Not on file   Outpatient Encounter Medications as of 01/02/2022  Medication Sig   busPIRone (BUSPAR) 5 MG tablet TAKE 1 TABLET BY MOUTH 3 TIMES DAILY AS NEEDED.   citalopram (CELEXA) 20 MG tablet Take 1 tablet (20 mg total) by mouth daily.   levothyroxine (SYNTHROID) 25 MCG tablet TAKE 1.5 TABLETS (37.5 MCG TOTAL) BY  MOUTH DAILY BEFORE BREAKFAST.   Upadacitinib ER (RINVOQ) 15 MG TB24 Take 1 tablet by mouth daily in the afternoon.   No facility-administered encounter medications on file as of 01/02/2022.   ALLERGIES: Allergies  Allergen Reactions   Sulfa Antibiotics Hives    VACCINATION STATUS: Immunization History  Administered Date(s) Administered   Influenza Inj Mdck Quad Pf 06/04/2018   Influenza,inj,Quad PF,6+ Mos 06/04/2017, 04/19/2019   Influenza-Unspecified 06/04/2018   PFIZER Comirnaty(Gray Top)Covid-19 Tri-Sucrose Vaccine 08/02/2020   PPD Test 03/22/2019    HPI Lori Koch is 46 y.o. female who presents today with a medical history as above. she is being seen in follow-up for hypothyroidism, multinodular goiter.  -She is on stable dose of levothyroxine 37.5 mcg p.o. daily before breakfast.    She continues to feel better, reports compliance to her medication.    -She has no new complaints today.   - She says she was diagnosed with hypothyroidism in 2011.  - She is also known to have nodular goiter, previsit thyroid ultrasound is significant for similar findings compared to prior studies.  Previous  thyroid ultrasound shows overall decrease in size of bilateral thyroid lobes and decrease in size of  thyroid nodule in the right lobe to 1.5 cm from 1.7 cm.  This nodule had no suspicious features.  Her previsit thyroid ultrasound documents similar findings. -In the interim, she was diagnosed with rheumatoid arthritis which required brief intervention with steroids which caused unintended weight gain of 5 pounds.      She has diet controlled type 2 diabetes with a reported A1c of 4.5%. -She denies palpitations, hot/cold intolerance. She denies dysphagia, shortness of breath, nor voice change. -She denies exposure to neck radiation. - She is a former smoker. - She has 3 grown kids status post tubal ligation in 1998.  Review of Systems Limited as above.  Objective:    BP 98/62    Pulse 64   Ht 5' 2.5" (1.588 m)   Wt 139 lb 3.2 oz (63.1 kg)   BMI 25.05 kg/m   Wt Readings from Last 3 Encounters:  01/02/22 139 lb 3.2 oz (63.1 kg)  07/26/21 140 lb 14.4 oz (63.9 kg)  06/26/21 140 lb (63.5 kg)    Physical Exam    Physical Exam- Limited  Constitutional:  Body mass index is 25.05 kg/m. , not in acute distress, normal state of mind   TSH from 03/10/2017 was 1.85 Recent Results (from the past 2160 hour(s))  TSH     Status: None   Collection Time: 12/28/21  8:05 AM  Result Value Ref Range   TSH 1.840 0.450 - 4.500 uIU/mL  T4, free     Status: None   Collection Time: 12/28/21  8:05 AM  Result Value Ref Range   Free T4 1.38 0.82 - 1.77 ng/dL   Thyroid ultrasound from 04/25/2017 showed right lobe 4.6 cm with 1.5 cm nodule; left lobe 3.3 cm with no nodules.   Thyroid ultrasound on May 04, 2018: Right lobe decrease in size to 3.6 cm from four-point recliner, left lobe decreased to 3.1 cm from 3.3 cm.  1.5 cm nodule on the right lobe, previously 1.7 cm.  Thyroid ultrasound on May 17, 2019 -Confirm 2-year stability of 1.6 cm category 3 nodule in the right medial gland.  Recommend continued follow-up every 1-2 years until 5-year stability has been confirmed.   Thyroid ultrasound on Nov 10, 2020:  IMPRESSION: 1. Nodule 1 located in the mid right thyroid lobe is not significantly changed in size since 04/25/2017. Follow-up ultrasound should be performed to document 5 years of stability. 2. Nodule 3 located in the left inferior thyroid lobe is not significantly changed in size since 05/17/2019. This meets criteria for surveillance. Follow-up thyroid ultrasound should be performed in 1 year.  Assessment & Plan:   1. Hypothyroidism 2. Thyroid nodule -Her previsit thyroid function tests are consistent with appropriate replacement.  She is advised to continue levothyroxine 37.5 mcg p.o. daily before breakfast.   - We discussed about the correct intake  of her thyroid hormone, on empty stomach at fasting, with water, separated by at least 30 minutes from breakfast and other medications,  and separated by more than 4 hours from calcium, iron, multivitamins, acid reflux medications (PPIs). -Patient is made aware of the fact that thyroid hormone replacement is needed for life, dose to be adjusted by periodic monitoring of thyroid function tests.   - Regarding 1.5 cm nodule on the right lobe and 0.9 cm nodule in the left lobe with no suspicious features, she will not need any intervention at this time.  She will be considered for 1 more surveillance ultrasound in 1 to 2 years.    -  I advised patient to maintain close follow up with Raliegh Ip, DO for primary care needs.   I spent 22 minutes in the care of the patient today including review of labs from Thyroid Function, CMP, and other relevant labs ; imaging/biopsy records (current and previous including abstractions from other facilities); face-to-face time discussing  her lab results and symptoms, medications doses, her options of short and long term treatment based on the latest standards of care / guidelines;   and documenting the encounter.  [name]  participated in the discussions, expressed understanding, and voiced agreement with the above plans.  All questions were answered to her satisfaction. she is encouraged to contact clinic should she have any questions or concerns prior to her return visit.   Follow up plan: Return in about 6 months (around 07/05/2022) for F/U with Pre-visit Labs.  Marquis Lunch, MD [facility] Phone: [phone]  Fax: [phone]   01/02/2022, 12:42 PM This note was partially dictated with voice recognition software. Similar sounding words can be transcribed inadequately or may not  be corrected upon review.

## 2022-03-04 ENCOUNTER — Other Ambulatory Visit: Payer: Self-pay

## 2022-03-10 ENCOUNTER — Telehealth: Payer: Self-pay | Admitting: Family

## 2022-03-10 DIAGNOSIS — R350 Frequency of micturition: Secondary | ICD-10-CM

## 2022-03-10 DIAGNOSIS — R399 Unspecified symptoms and signs involving the genitourinary system: Secondary | ICD-10-CM

## 2022-03-10 MED ORDER — CEPHALEXIN 500 MG PO CAPS: 500.0000 mg | ORAL_CAPSULE | Freq: Two times a day (BID) | ORAL | 0 refills | Status: DC

## 2022-03-10 NOTE — Progress Notes (Signed)

## 2022-03-19 ENCOUNTER — Other Ambulatory Visit: Payer: Self-pay | Admitting: Family Medicine

## 2022-03-19 DIAGNOSIS — Z1231 Encounter for screening mammogram for malignant neoplasm of breast: Secondary | ICD-10-CM

## 2022-03-21 ENCOUNTER — Inpatient Hospital Stay: Admission: RE | Admit: 2022-03-21 | Payer: No Typology Code available for payment source | Source: Ambulatory Visit

## 2022-03-25 ENCOUNTER — Inpatient Hospital Stay: Admission: RE | Admit: 2022-03-25 | Payer: No Typology Code available for payment source | Source: Ambulatory Visit

## 2022-03-25 ENCOUNTER — Ambulatory Visit
Admission: RE | Admit: 2022-03-25 | Discharge: 2022-03-25 | Disposition: A | Discharge status: Home / Self Care | Payer: No Typology Code available for payment source | Source: Ambulatory Visit | Attending: Family Medicine | Admitting: Family Medicine

## 2022-03-25 DIAGNOSIS — Z1231 Encounter for screening mammogram for malignant neoplasm of breast: Secondary | ICD-10-CM

## 2022-03-26 ENCOUNTER — Ambulatory Visit (INDEPENDENT_AMBULATORY_CARE_PROVIDER_SITE_OTHER): Payer: Self-pay

## 2022-03-26 DIAGNOSIS — Z23 Encounter for immunization: Secondary | ICD-10-CM

## 2022-04-22 ENCOUNTER — Other Ambulatory Visit: Payer: Self-pay | Admitting: "Endocrinology

## 2022-05-06 ENCOUNTER — Other Ambulatory Visit: Payer: Self-pay

## 2022-06-18 ENCOUNTER — Telehealth: Payer: Self-pay | Admitting: Physician Assistant

## 2022-06-18 DIAGNOSIS — R3989 Other symptoms and signs involving the genitourinary system: Secondary | ICD-10-CM

## 2022-06-18 MED ORDER — CEPHALEXIN 500 MG PO CAPS: 500.0000 mg | ORAL_CAPSULE | Freq: Two times a day (BID) | ORAL | 0 refills | Status: AC

## 2022-06-18 NOTE — Progress Notes (Signed)
I have spent 5 minutes in review of e-visit questionnaire, review and updating patient chart, medical decision making and response to patient.   Ryder Man Cody Breckyn Ticas, PA-C    

## 2022-06-18 NOTE — Progress Notes (Signed)

## 2022-06-27 ENCOUNTER — Encounter: Payer: Self-pay | Admitting: Family Medicine

## 2022-06-27 ENCOUNTER — Ambulatory Visit (INDEPENDENT_AMBULATORY_CARE_PROVIDER_SITE_OTHER): Payer: Self-pay | Admitting: Family Medicine

## 2022-06-27 VITALS — BP 129/77 | HR 66 | Temp 97.4°F | Ht 62.5 in | Wt 142.6 lb

## 2022-06-27 DIAGNOSIS — R5381 Other malaise: Secondary | ICD-10-CM

## 2022-06-27 DIAGNOSIS — M2550 Pain in unspecified joint: Secondary | ICD-10-CM

## 2022-06-27 DIAGNOSIS — R5383 Other fatigue: Secondary | ICD-10-CM

## 2022-06-27 MED ORDER — PREDNISONE 20 MG PO TABS
40.0000 mg | ORAL_TABLET | Freq: Every day | ORAL | 0 refills | Status: AC
Start: 2022-06-27 — End: 2022-07-02

## 2022-06-27 NOTE — Progress Notes (Signed)
Subjective:  Patient ID: Lori Koch, female    DOB: [birth year], 47 y.o.   MRN: [id number]  Patient Care Team: Janora Norlander, DO as PCP - General (Family Medicine)   Chief Complaint:  Joint Pain (Worsening left knee pain,left hand pain and neck pain  )   HPI: Lori Koch is a 47 y.o. female presenting on 06/27/2022 for Joint Pain (Worsening left knee pain,left hand pain and neck pain  )   1. Multiple joint pain Pt reports worsening joint pain over the last several months. She does have RA and is on Rinvoq. States several months ago she was hit in the side of her left knee with a weedeater string which caused pain and bruising. States she continues to have aching pain in her knee. Is able to ambulate but knee is sore. Does have swelling to lateral and medial knee at times. Has been using heat and ice without relief of symptoms. She also reports right hand pain, palmar aspect. Denies injury. Also reports her neck has been bothering her more over the last several weeks. No injury or focal neurological deficits. Has not followed up with rheumatology.   2. Malaise and fatigue Ongoing and worsening over the last 2-3 months. Has follow up with endo scheduled to recheck thyroid soon. States she is concerned she may be anemic. Denies chest pain, shortness of breath, palpitations, dizziness, or weakness.      Relevant past medical, surgical, family, and social history reviewed and updated as indicated.  Allergies and medications reviewed and updated. Data reviewed: Chart in Epic.   Past Medical History:  Diagnosis Date   Diabetes mellitus without complication ([place]) 1740   diet only   Thyroid disease    hypothyroidism    Past Surgical History:  Procedure Laterality Date   TUBAL LIGATION  1998    Social History   Socioeconomic History   Marital status: Married    Spouse name: Not on file   Number of children: 3   Years of education: Not on file   Highest education  level: GED or equivalent  Occupational History   Not on file  Tobacco Use   Smoking status: Former    Packs/day: 0.50    Years: 10.00    Total pack years: 5.00    Types: Cigarettes    Quit date: 24    Years since quitting: 29.0   Smokeless tobacco: Never  Vaping Use   Vaping Use: Never used  Substance and Sexual Activity   Alcohol use: No   Drug use: No   Sexual activity: Yes    Birth control/protection: None  Other Topics Concern   Not on file  Social History Narrative   Not on file   Social Determinants of Health   Financial Resource Strain: Not on file  Food Insecurity: No Food Insecurity (07/26/2021)   Hunger Vital Sign    Worried About Running Out of Food in the Last Year: Never true    Ran Out of Food in the Last Year: Never true  Transportation Needs: No Transportation Needs (07/26/2021)   PRAPARE - Hydrologist (Medical): No    Lack of Transportation (Non-Medical): No  Physical Activity: Not on file  Stress: Not on file  Social Connections: Not on file  Intimate Partner Violence: Not on file    Outpatient Encounter Medications as of 06/27/2022  Medication Sig   busPIRone (BUSPAR) 5 MG tablet TAKE 1  TABLET BY MOUTH 3 TIMES DAILY AS NEEDED.   citalopram (CELEXA) 20 MG tablet Take 1 tablet (20 mg total) by mouth daily.   levothyroxine (SYNTHROID) 25 MCG tablet TAKE 1.5 TABLETS (37.5 MCG TOTAL) BY MOUTH DAILY BEFORE BREAKFAST.   predniSONE (DELTASONE) 20 MG tablet Take 2 tablets (40 mg total) by mouth daily with breakfast for 5 days.   Upadacitinib ER (RINVOQ) 15 MG TB24 Take 1 tablet by mouth daily in the afternoon.   No facility-administered encounter medications on file as of 06/27/2022.    Allergies  Allergen Reactions   Sulfa Antibiotics Hives    Review of Systems  Constitutional:  Positive for activity change and fatigue. Negative for appetite change, chills, diaphoresis, fever and unexpected weight change.  HENT: Negative.     Eyes: Negative.  Negative for photophobia and visual disturbance.  Respiratory:  Negative for cough, chest tightness and shortness of breath.   Cardiovascular:  Negative for chest pain, palpitations and leg swelling.  Gastrointestinal:  Negative for abdominal pain, blood in stool, constipation, diarrhea, nausea and vomiting.  Endocrine: Negative.   Genitourinary:  Negative for decreased urine volume, difficulty urinating, dysuria, frequency and urgency.  Musculoskeletal:  Positive for arthralgias and joint swelling. Negative for back pain, gait problem, myalgias, neck pain and neck stiffness.  Skin: Negative.   Allergic/Immunologic: Negative.   Neurological:  Negative for dizziness, tremors, seizures, syncope, facial asymmetry, speech difficulty, weakness, light-headedness, numbness and headaches.  Hematological: Negative.   Psychiatric/Behavioral:  Negative for agitation, confusion, hallucinations, sleep disturbance and suicidal ideas.   All other systems reviewed and are negative.       Objective:  BP 129/77   Pulse 66   Temp (!) 97.4 F (36.3 C) (Temporal)   Ht 5' 2.5" (1.588 m)   Wt 142 lb 9.6 oz (64.7 kg)   SpO2 100%   BMI 25.67 kg/m    Wt Readings from Last 3 Encounters:  06/27/22 142 lb 9.6 oz (64.7 kg)  01/02/22 139 lb 3.2 oz (63.1 kg)  07/26/21 140 lb 14.4 oz (63.9 kg)    Physical Exam Vitals and nursing note reviewed.  Constitutional:      General: She is not in acute distress.    Appearance: Normal appearance. She is well-developed, well-groomed and normal weight. She is not ill-appearing, toxic-appearing or diaphoretic.  HENT:     Head: Normocephalic and atraumatic.     Jaw: There is normal jaw occlusion.     Right Ear: Hearing normal.     Left Ear: Hearing normal.     Nose: Nose normal.     Mouth/Throat:     Lips: Pink.     Mouth: Mucous membranes are moist.     Pharynx: Oropharynx is clear. Uvula midline.  Eyes:     General: Lids are normal.      Extraocular Movements: Extraocular movements intact.     Conjunctiva/sclera: Conjunctivae normal.     Pupils: Pupils are equal, round, and reactive to light.  Neck:     Thyroid: No thyroid mass, thyromegaly or thyroid tenderness.     Vascular: No carotid bruit or JVD.     Trachea: Trachea and phonation normal.  Cardiovascular:     Rate and Rhythm: Normal rate and regular rhythm.     Chest Wall: PMI is not displaced.     Pulses: Normal pulses.     Heart sounds: Normal heart sounds. No murmur heard.    No friction rub. No gallop.  Pulmonary:  Effort: Pulmonary effort is normal. No respiratory distress.     Breath sounds: Normal breath sounds. No wheezing.  Abdominal:     General: Bowel sounds are normal. There is no distension or abdominal bruit.     Palpations: Abdomen is soft. There is no hepatomegaly or splenomegaly.     Tenderness: There is no abdominal tenderness. There is no right CVA tenderness or left CVA tenderness.     Hernia: No hernia is present.  Musculoskeletal:        General: Normal range of motion.     Right wrist: Normal.     Right hand: Tenderness (palmar aspect and right proximal phalanx of ring finger) present. No swelling, deformity, lacerations or bony tenderness. Normal range of motion. Normal strength. Normal sensation. There is no disruption of two-point discrimination. Normal capillary refill. Normal pulse.     Cervical back: Normal range of motion and neck supple. Tenderness present. No swelling, edema, deformity, erythema, signs of trauma, lacerations, rigidity, spasms, torticollis, bony tenderness or crepitus. No pain with movement. Normal range of motion.     Thoracic back: Normal.     Left upper leg: Normal.     Left knee: Swelling and effusion present. No deformity, erythema, ecchymosis, lacerations, bony tenderness or crepitus. Normal range of motion. Tenderness present over the medial joint line. No LCL laxity.Normal alignment, normal meniscus and  normal patellar mobility.     Right lower leg: Normal. No edema.     Left lower leg: Normal. No edema.  Lymphadenopathy:     Cervical: No cervical adenopathy.  Skin:    General: Skin is warm and dry.     Capillary Refill: Capillary refill takes less than 2 seconds.     Coloration: Skin is not cyanotic, jaundiced or pale.     Findings: No rash.  Neurological:     General: No focal deficit present.     Mental Status: She is alert and oriented to person, place, and time.     Sensory: Sensation is intact.     Motor: Motor function is intact.     Coordination: Coordination is intact.     Gait: Gait is intact.     Deep Tendon Reflexes: Reflexes are normal and symmetric.  Psychiatric:        Attention and Perception: Attention and perception normal.        Mood and Affect: Mood and affect normal.        Speech: Speech normal.        Behavior: Behavior normal. Behavior is cooperative.        Thought Content: Thought content normal.        Cognition and Memory: Cognition and memory normal.        Judgment: Judgment normal.     Results for orders placed or performed in visit on 07/26/21  Fecal occult blood, imunochemical   Specimen: STOOL  Result Value Ref Range   Fecal Occult Bld Negative Negative       Pertinent labs & imaging results that were available during my care of the patient were reviewed by me and considered in my medical decision making.  Assessment & Plan:  Addaleigh was seen today for joint pain.  Diagnoses and all orders for this visit:  Multiple joint pain Followed by Rheumatology on currently on Rinvoq . States over the last several months she has had increased pain in her left knee, right hand, and neck. Has been using heat and cold therapy without relief  of symptoms. Will burst with a short course of prednisone to avoid immunosuppression. Pt advised to follow up with her rheumatologist.   -     CMP14+EGFR -     CBC with Differential/Platelet -     VITAMIN D 25  Hydroxy (Vit-D Deficiency, Fractures) -     predniSONE (DELTASONE) 20 MG tablet; Take 2 tablets (40 mg total) by mouth daily with breakfast for 5 days.  Malaise and fatigue Worsening over the last few months. No recent labs. Has follow up with Dr. Dorris Fetch for thyroid in a few weeks. Will check below to evaluate for potential underlying causes.  -     CMP14+EGFR -     CBC with Differential/Platelet -     VITAMIN D 25 Hydroxy (Vit-D Deficiency, Fractures)     Continue all other maintenance medications.  Follow up plan: Return if symptoms worsen or fail to improve.   Continue healthy lifestyle choices, including diet (rich in fruits, vegetables, and lean proteins, and low in salt and simple carbohydrates) and exercise (at least 30 minutes of moderate physical activity daily).  Educational handout given for joint pain  The above assessment and management plan was discussed with the patient. The patient verbalized understanding of and has agreed to the management plan. Patient is aware to call the clinic if they develop any new symptoms or if symptoms persist or worsen. Patient is aware when to return to the clinic for a follow-up visit. Patient educated on when it is appropriate to go to the emergency department.   Monia Pouch, FNP-C [facility] [phone]

## 2022-06-28 ENCOUNTER — Encounter: Payer: Self-pay | Admitting: Family Medicine

## 2022-06-28 LAB — CBC WITH DIFFERENTIAL/PLATELET
Basophils Absolute: 0 10*3/uL (ref 0.0–0.2)
Basos: 1 %
EOS (ABSOLUTE): 0 10*3/uL (ref 0.0–0.4)
Eos: 1 %
Hematocrit: 37.6 % (ref 34.0–46.6)
Hemoglobin: 12.5 g/dL (ref 11.1–15.9)
Immature Grans (Abs): 0 10*3/uL (ref 0.0–0.1)
Immature Granulocytes: 0 %
Lymphocytes Absolute: 1.2 10*3/uL (ref 0.7–3.1)
Lymphs: 33 %
MCH: 33 pg (ref 26.6–33.0)
MCHC: 33.2 g/dL (ref 31.5–35.7)
MCV: 99 fL — ABNORMAL HIGH (ref 79–97)
Monocytes Absolute: 0.3 10*3/uL (ref 0.1–0.9)
Monocytes: 8 %
Neutrophils Absolute: 2.2 10*3/uL (ref 1.4–7.0)
Neutrophils: 57 %
Platelets: 217 10*3/uL (ref 150–450)
RBC: 3.79 x10E6/uL (ref 3.77–5.28)
RDW: 12.1 % (ref 11.7–15.4)
WBC: 3.7 10*3/uL (ref 3.4–10.8)

## 2022-06-28 LAB — CMP14+EGFR
ALT: 18 IU/L (ref 0–32)
AST: 16 IU/L (ref 0–40)
Albumin/Globulin Ratio: 2.3 — ABNORMAL HIGH (ref 1.2–2.2)
Albumin: 4.4 g/dL (ref 3.9–4.9)
Alkaline Phosphatase: 54 IU/L (ref 44–121)
BUN/Creatinine Ratio: 25 — ABNORMAL HIGH (ref 9–23)
BUN: 21 mg/dL (ref 6–24)
Bilirubin Total: 0.3 mg/dL (ref 0.0–1.2)
CO2: 22 mmol/L (ref 20–29)
Calcium: 8.8 mg/dL (ref 8.7–10.2)
Chloride: 105 mmol/L (ref 96–106)
Creatinine, Ser: 0.83 mg/dL (ref 0.57–1.00)
Globulin, Total: 1.9 g/dL (ref 1.5–4.5)
Glucose: 82 mg/dL (ref 70–99)
Potassium: 4.2 mmol/L (ref 3.5–5.2)
Sodium: 141 mmol/L (ref 134–144)
Total Protein: 6.3 g/dL (ref 6.0–8.5)
eGFR: 87 mL/min/{1.73_m2} (ref >59)

## 2022-06-28 LAB — VITAMIN D 25 HYDROXY (VIT D DEFICIENCY, FRACTURES): Vit D, 25-Hydroxy: 34.5 ng/mL (ref 30.0–100.0)

## 2022-07-02 ENCOUNTER — Other Ambulatory Visit: Payer: Self-pay

## 2022-07-03 LAB — TSH: TSH: 2.98 u[IU]/mL (ref 0.450–4.500)

## 2022-07-03 LAB — T4, FREE: Free T4: 1.56 ng/dL (ref 0.82–1.77)

## 2022-07-09 ENCOUNTER — Ambulatory Visit (INDEPENDENT_AMBULATORY_CARE_PROVIDER_SITE_OTHER): Payer: Self-pay | Admitting: "Endocrinology

## 2022-07-09 ENCOUNTER — Encounter: Payer: Self-pay | Admitting: "Endocrinology

## 2022-07-09 VITALS — BP 114/82 | HR 72 | Ht 62.5 in | Wt 144.4 lb

## 2022-07-09 DIAGNOSIS — E039 Hypothyroidism, unspecified: Secondary | ICD-10-CM

## 2022-07-09 DIAGNOSIS — E042 Nontoxic multinodular goiter: Secondary | ICD-10-CM

## 2022-07-09 DIAGNOSIS — R5383 Other fatigue: Secondary | ICD-10-CM | POA: Insufficient documentation

## 2022-07-09 MED ORDER — LEVOTHYROXINE SODIUM 25 MCG PO TABS: 37.5000 ug | ORAL_TABLET | Freq: Every day | ORAL | 1 refills | Status: DC

## 2022-07-09 NOTE — Progress Notes (Signed)
07/09/2022        Endocrinology follow-up note    Subjective:    Patient ID: Lori Koch, female    DOB: 01-Aug-1975, PCP Janora Norlander, DO   Past Medical History:  Diagnosis Date   Diabetes mellitus without complication ([place]) 0350   diet only   Thyroid disease    hypothyroidism   Past Surgical History:  Procedure Laterality Date   TUBAL LIGATION  1998   Social History   Socioeconomic History   Marital status: Married    Spouse name: Not on file   Number of children: 3   Years of education: Not on file   Highest education level: GED or equivalent  Occupational History   Not on file  Tobacco Use   Smoking status: Former    Packs/day: 0.50    Years: 10.00    Total pack years: 5.00    Types: Cigarettes    Quit date: 1995    Years since quitting: 29.0   Smokeless tobacco: Never  Vaping Use   Vaping Use: Never used  Substance and Sexual Activity   Alcohol use: No   Drug use: No   Sexual activity: Yes    Birth control/protection: None  Other Topics Concern   Not on file  Social History Narrative   Not on file   Social Determinants of Health   Financial Resource Strain: Not on file  Food Insecurity: No Food Insecurity (07/26/2021)   Hunger Vital Sign    Worried About Running Out of Food in the Last Year: Never true    Ran Out of Food in the Last Year: Never true  Transportation Needs: No Transportation Needs (07/26/2021)   PRAPARE - Hydrologist (Medical): No    Lack of Transportation (Non-Medical): No  Physical Activity: Not on file  Stress: Not on file  Social Connections: Not on file   Outpatient Encounter Medications as of 07/09/2022  Medication Sig   busPIRone (BUSPAR) 5 MG tablet TAKE 1 TABLET BY MOUTH 3 TIMES DAILY AS NEEDED.   citalopram (CELEXA) 20 MG tablet Take 1 tablet (20 mg total) by mouth daily.   levothyroxine (SYNTHROID) 25 MCG tablet Take 1.5 tablets (37.5 mcg total) by  mouth daily before breakfast.   Upadacitinib ER (RINVOQ) 15 MG TB24 Take 1 tablet by mouth daily in the afternoon.   [DISCONTINUED] levothyroxine (SYNTHROID) 25 MCG tablet TAKE 1.5 TABLETS (37.5 MCG TOTAL) BY MOUTH DAILY BEFORE BREAKFAST.   No facility-administered encounter medications on file as of 07/09/2022.   ALLERGIES: Allergies  Allergen Reactions   Sulfa Antibiotics Hives    VACCINATION STATUS: Immunization History  Administered Date(s) Administered   Influenza Inj Mdck Quad Pf 06/04/2018   Influenza,inj,Quad PF,6+ Mos 06/04/2017, 04/19/2019, 03/26/2022   Influenza-Unspecified 06/04/2018   PFIZER Comirnaty(Gray Top)Covid-19 Tri-Sucrose Vaccine 08/02/2020   PPD Test 03/22/2019, 03/11/2022    HPI Lori Koch is 47 y.o. female who presents today with a medical history as above. she is being seen in follow-up for hypothyroidism, multinodular goiter.  -She is currently on levothyroxine 37.5 mg p.o. daily before breakfast.  She continues to feel better, reports compliance to her medication.    -She continues to complain of fatigue.  She admits to dietary inconsistencies and lack of regulated sleep time. - She says she was diagnosed with hypothyroidism in 2011.  - She is also known to have nodular goiter, previsit thyroid ultrasound is  significant for similar findings compared to prior studies.  Previous  thyroid ultrasound shows overall decrease in size of bilateral thyroid lobes and decrease in size of thyroid nodule in the right lobe to 1.5 cm from 1.7 cm.  This nodule had no suspicious features.  Her previsit thyroid ultrasound documents similar findings. -In the interim, she was diagnosed with rheumatoid arthritis which required brief intervention with steroids which caused unintended weight gain of 5 pounds.      She has diet controlled type 2 diabetes with a reported A1c of 4.5%. -She denies palpitations, hot/cold intolerance. She denies dysphagia, shortness of breath,  nor voice change. -She denies exposure to neck radiation. - She is a former smoker. - She has 3 grown kids status post tubal ligation in 1998.  Review of Systems Limited as above.  Objective:    BP 114/82   Pulse 72   Ht 5' 2.5" (1.588 m)   Wt 144 lb 6.4 oz (65.5 kg)   BMI 25.99 kg/m   Wt Readings from Last 3 Encounters:  07/09/22 144 lb 6.4 oz (65.5 kg)  06/27/22 142 lb 9.6 oz (64.7 kg)  01/02/22 139 lb 3.2 oz (63.1 kg)    Physical Exam    Physical Exam- Limited  Constitutional:  Body mass index is 25.99 kg/m. , not in acute distress, normal state of mind   TSH from 03/10/2017 was 1.85 Recent Results (from the past 2160 hour(s))  CMP14+EGFR     Status: Abnormal   Collection Time: 06/27/22  8:22 AM  Result Value Ref Range   Glucose 82 70 - 99 mg/dL   BUN 21 6 - 24 mg/dL   Creatinine, Ser 0.83 0.57 - 1.00 mg/dL   eGFR 87 >59 mL/min/1.73   BUN/Creatinine Ratio 25 (H) 9 - 23   Sodium 141 134 - 144 mmol/L   Potassium 4.2 3.5 - 5.2 mmol/L   Chloride 105 96 - 106 mmol/L   CO2 22 20 - 29 mmol/L   Calcium 8.8 8.7 - 10.2 mg/dL   Total Protein 6.3 6.0 - 8.5 g/dL   Albumin 4.4 3.9 - 4.9 g/dL   Globulin, Total 1.9 1.5 - 4.5 g/dL   Albumin/Globulin Ratio 2.3 (H) 1.2 - 2.2   Bilirubin Total 0.3 0.0 - 1.2 mg/dL   Alkaline Phosphatase 54 44 - 121 IU/L   AST 16 0 - 40 IU/L   ALT 18 0 - 32 IU/L  CBC with Differential/Platelet     Status: Abnormal   Collection Time: 06/27/22  8:22 AM  Result Value Ref Range   WBC 3.7 3.4 - 10.8 x10E3/uL   RBC 3.79 3.77 - 5.28 x10E6/uL   Hemoglobin 12.5 11.1 - 15.9 g/dL   Hematocrit 37.6 34.0 - 46.6 %   MCV 99 (H) 79 - 97 fL   MCH 33.0 26.6 - 33.0 pg   MCHC 33.2 31.5 - 35.7 g/dL   RDW 12.1 11.7 - 15.4 %   Platelets 217 150 - 450 x10E3/uL   Neutrophils 57 Not Estab. %   Lymphs 33 Not Estab. %   Monocytes 8 Not Estab. %   Eos 1 Not Estab. %   Basos 1 Not Estab. %   Neutrophils Absolute 2.2 1.4 - 7.0 x10E3/uL   Lymphocytes Absolute 1.2  0.7 - 3.1 x10E3/uL   Monocytes Absolute 0.3 0.1 - 0.9 x10E3/uL   EOS (ABSOLUTE) 0.0 0.0 - 0.4 x10E3/uL   Basophils Absolute 0.0 0.0 - 0.2 x10E3/uL   Immature Granulocytes 0  Not Estab. %   Immature Grans (Abs) 0.0 0.0 - 0.1 x10E3/uL  VITAMIN D 25 Hydroxy (Vit-D Deficiency, Fractures)     Status: None   Collection Time: 06/27/22  8:22 AM  Result Value Ref Range   Vit D, 25-Hydroxy 34.5 30.0 - 100.0 ng/mL    Comment: Vitamin D deficiency has been defined by the Institute of Medicine and an Endocrine Society practice guideline as a level of serum 25-OH vitamin D less than 20 ng/mL (1,2). The Endocrine Society went on to further define vitamin D insufficiency as a level between 21 and 29 ng/mL (2). 1. IOM (Institute of Medicine). 2010. Dietary reference    intakes for calcium and D. [place]: The    [employer]. 2. Holick MF, Binkley Coplay, Bischoff-Ferrari HA, et al.    Evaluation, treatment, and prevention of vitamin D    deficiency: an Endocrine Society clinical practice    guideline. JCEM. 2011 Jul; 96(7):1911-30.   T4, free     Status: None   Collection Time: 07/02/22  8:24 AM  Result Value Ref Range   Free T4 1.56 0.82 - 1.77 ng/dL  TSH     Status: None   Collection Time: 07/02/22  8:24 AM  Result Value Ref Range   TSH 2.980 0.450 - 4.500 uIU/mL   Thyroid ultrasound from 04/25/2017 showed right lobe 4.6 cm with 1.5 cm nodule; left lobe 3.3 cm with no nodules.   Thyroid ultrasound on May 04, 2018: Right lobe decrease in size to 3.6 cm from four-point recliner, left lobe decreased to 3.1 cm from 3.3 cm.  1.5 cm nodule on the right lobe, previously 1.7 cm.  Thyroid ultrasound on May 17, 2019 -Confirm 2-year stability of 1.6 cm category 3 nodule in the right medial gland.  Recommend continued follow-up every 1-2 years until 5-year stability has been confirmed.   Thyroid ultrasound on Nov 10, 2020:  IMPRESSION: 1. Nodule 1 located in the mid right  thyroid lobe is not significantly changed in size since 04/25/2017. Follow-up ultrasound should be performed to document 5 years of stability. 2. Nodule 3 located in the left inferior thyroid lobe is not significantly changed in size since 05/17/2019. This meets criteria for surveillance. Follow-up thyroid ultrasound should be performed in 1 year.  Assessment & Plan:   1. Hypothyroidism 2. Thyroid nodule -Her previsit thyroid function tests are consistent with appropriate replacement.  She is advised to continue levothyroxine 37.5 mcg p.o. daily before breakfast.   - We discussed about the correct intake of her thyroid hormone, on empty stomach at fasting, with water, separated by at least 30 minutes from breakfast and other medications,  and separated by more than 4 hours from calcium, iron, multivitamins, acid reflux medications (PPIs). -Patient is made aware of the fact that thyroid hormone replacement is needed for life, dose to be adjusted by periodic monitoring of thyroid function tests.   - Regarding 1.5 cm nodule on the right lobe and 0.9 cm nodule in the left lobe with no suspicious features, she will not need any intervention at this time.  She will be considered for 1 more surveillance ultrasound of thyroid before her next visit in 6 months.    She will be assessed with a.m. cortisol, CMP, vitamin D and vitamin B12 before next visit.  - I advised patient to maintain close follow up with Raliegh Ip, DO for primary care needs.  I spent 21 minutes in the care of the patient  today including review of labs from Thyroid Function, CMP, and other relevant labs ; imaging/biopsy records (current and previous including abstractions from other facilities); face-to-face time discussing  her lab results and symptoms, medications doses, her options of short and long term treatment based on the latest standards of care / guidelines;   and documenting the encounter.  [name]   participated in the discussions, expressed understanding, and voiced agreement with the above plans.  All questions were answered to her satisfaction. she is encouraged to contact clinic should she have any questions or concerns prior to her return visit.    Follow up plan: Return in about 6 months (around 01/07/2023) for F/U with Pre-visit Labs, Thyroid / Neck Ultrasound.  Marquis Lunch, MD [facility] Phone: [phone]  Fax: [phone]   07/09/2022, 9:41 AM This note was partially dictated with voice recognition software. Similar sounding words can be transcribed inadequately or may not  be corrected upon review.

## 2022-07-10 ENCOUNTER — Other Ambulatory Visit: Payer: Self-pay

## 2022-07-11 ENCOUNTER — Encounter: Payer: Self-pay | Admitting: "Endocrinology

## 2022-07-11 LAB — COMPREHENSIVE METABOLIC PANEL
ALT: 22 IU/L (ref 0–32)
AST: 18 IU/L (ref 0–40)
Albumin/Globulin Ratio: 2.3 — ABNORMAL HIGH (ref 1.2–2.2)
Albumin: 4.3 g/dL (ref 3.9–4.9)
Alkaline Phosphatase: 47 IU/L (ref 44–121)
BUN/Creatinine Ratio: 16 (ref 9–23)
BUN: 14 mg/dL (ref 6–24)
Bilirubin Total: 0.3 mg/dL (ref 0.0–1.2)
CO2: 21 mmol/L (ref 20–29)
Calcium: 8.9 mg/dL (ref 8.7–10.2)
Chloride: 104 mmol/L (ref 96–106)
Creatinine, Ser: 0.9 mg/dL (ref 0.57–1.00)
Globulin, Total: 1.9 g/dL (ref 1.5–4.5)
Glucose: 85 mg/dL (ref 70–99)
Potassium: 4.4 mmol/L (ref 3.5–5.2)
Sodium: 139 mmol/L (ref 134–144)
Total Protein: 6.2 g/dL (ref 6.0–8.5)
eGFR: 79 mL/min/{1.73_m2} (ref >59)

## 2022-07-11 LAB — TSH: TSH: 1.56 u[IU]/mL (ref 0.450–4.500)

## 2022-07-11 LAB — VITAMIN B12: Vitamin B-12: 237 pg/mL (ref 232–1245)

## 2022-07-11 LAB — T4, FREE: Free T4: 1.13 ng/dL (ref 0.82–1.77)

## 2022-07-11 LAB — VITAMIN D 25 HYDROXY (VIT D DEFICIENCY, FRACTURES): Vit D, 25-Hydroxy: 32.3 ng/mL (ref 30.0–100.0)

## 2022-07-11 LAB — CORTISOL-AM, BLOOD: Cortisol - AM: 10.6 ug/dL (ref 6.2–19.4)

## 2022-09-03 ENCOUNTER — Other Ambulatory Visit: Payer: Self-pay

## 2022-09-11 ENCOUNTER — Other Ambulatory Visit: Payer: Self-pay | Admitting: Family Medicine

## 2022-09-11 ENCOUNTER — Telehealth: Payer: Self-pay | Admitting: Family Medicine

## 2022-09-11 DIAGNOSIS — E782 Mixed hyperlipidemia: Secondary | ICD-10-CM

## 2022-09-11 NOTE — Telephone Encounter (Signed)
Will place referral but I will ask Loma Sousa to not submit until new ins card on file so we can refer appropriately.

## 2022-09-11 NOTE — Telephone Encounter (Signed)
Patient was not fasting it was 255. She is getting them to send [place] a copy. She will come in in two months to repeat. She will diet and would like the referral for dietician. Also her insurance will be in effect 09/16/22 she will bring card down here as soon as she gets it.

## 2022-09-11 NOTE — Telephone Encounter (Signed)
Was her cholesterol a FASTING cholesterol that was obtained?  I'd like a copy of those labs and I will place future order for repeat to be obtained in 2 months.  In the meantime, please refer her to [place] diet.  I can place referral to dietician if she feels this would be helpful.

## 2022-09-13 ENCOUNTER — Encounter: Payer: Self-pay | Admitting: Family Medicine

## 2022-10-17 ENCOUNTER — Encounter: Payer: PRIVATE HEALTH INSURANCE | Attending: Family Medicine | Admitting: Nutrition

## 2022-10-17 ENCOUNTER — Encounter: Payer: Self-pay | Admitting: Nutrition

## 2022-10-17 VITALS — Ht 63.0 in | Wt 145.6 lb

## 2022-10-17 DIAGNOSIS — E782 Mixed hyperlipidemia: Secondary | ICD-10-CM

## 2022-10-17 NOTE — Progress Notes (Signed)
Medical Nutrition Therapy  Appointment Start time:  0900  Appointment End time:  1000  Primary concerns today: CVD, Hyperlipidemia Referral diagnosis: E78.2 Preferred learning style: NO preference  Learning readiness: Ready    NUTRITION ASSESSMENT  47 yr old wfemale here for hyperlipidemia. PMH: RA, Hyperlipiemia, Hypothyroid. Referred by her PCP. Hyperlipidemia: TCHOL 220, TG 94 mg/dl. LDL 132 mg/dl. VLDL 16  She notes her lipids are elevated most likely due to her snacking and processed foods she eats. Drinks sodas.  Getting ready to plant her garden. Looking forward to eating more plant based foods.  She is willing to work with lifestyle medicine to reverse her hyperlipidemia and improve her RA.Marland Kitchen   Anthropometrics  Wt Readings from Last 3 Encounters:  10/17/22 145 lb 9.6 oz (66 kg)  07/09/22 144 lb 6.4 oz (65.5 kg)  06/27/22 142 lb 9.6 oz (64.7 kg)   Ht Readings from Last 3 Encounters:  10/17/22 5\' 3"  (1.6 m)  07/09/22 5' 2.5" (1.588 m)  06/27/22 5' 2.5" (1.588 m)   Body mass index is 25.79 kg/m. @BMIFA @ Facility age limit for growth %iles is 20 years. Facility age limit for growth %iles is 20 years.    Clinical Medical Hx: RA, Hypothyroidism, Hyperlipidemia Medications: see chart Labs:  Lab Results  Component Value Date   HGBA1C 5.1 10/28/2017      Latest Ref Rng & Units 07/10/2022    8:10 AM 06/27/2022    8:22 AM 10/28/2017    9:16 AM  CMP  Glucose 70 - 99 mg/dL 85  82  82   BUN 6 - 24 mg/dL 14  21  17    Creatinine 0.57 - 1.00 mg/dL 4.09  8.11  9.14   Sodium 134 - 144 mmol/L 139  141  138   Potassium 3.5 - 5.2 mmol/L 4.4  4.2  4.2   Chloride 96 - 106 mmol/L 104  105  102   CO2 20 - 29 mmol/L 21  22  22    Calcium 8.7 - 10.2 mg/dL 8.9  8.8  8.9   Total Protein 6.0 - 8.5 g/dL 6.2  6.3  6.3   Total Bilirubin 0.0 - 1.2 mg/dL 0.3  0.3  0.3   Alkaline Phos 44 - 121 IU/L 47  54  63   AST 0 - 40 IU/L 18  16  18    ALT 0 - 32 IU/L 22  18  19     Lipid Panel   TCHOL 220, TG 94 mg/dl, HDL 72 mg/dl, VLDL 16 mg/dl and LDL 782 mg/dl  Notable Signs/Symptoms: None  Lifestyle & Dietary Hx 47 yr old wfemale married.  Estimated daily fluid intake: 40  sodas oz Supplements:  Sleep: varies Stress / self-care:  Current average weekly physical activity: cares for a [employer].  24-Hr Dietary Recall Eats 2-3 meals per day. Drinks sodas, Snacks on potato chips and processed foods. Admits to eating spam most mornings.  Wants to eat better.  Estimated Energy Needs Calories: 1500 Carbohydrate: 170g Protein: 112g Fat: 42g   NUTRITION DIAGNOSIS  NI-5.6.2 Excessive fat intake As related to high processed foods in diet.  As evidenced by LDL 132 mg/dl.   NUTRITION INTERVENTION  Nutrition education (E-1) on the following topics:  Nutrition and Diabetes education provided on My Plate, CHO counting, meal planning, portion sizes, timing of meals, avoiding snacks between meals unless having a low blood sugar, target ranges for A1C and blood sugars, signs/symptoms and treatment of hyper/hypoglycemia, monitoring blood sugars, taking  medications as prescribed, benefits of exercising 30 minutes per day and prevention of complications of DM. High Fiber diet  Handouts Provided Include  Lifestyle Medicien  Learning Style & Readiness for Change Teaching method utilized: Visual & Auditory  Demonstrated degree of understanding via: Teach Back  Barriers to learning/adherence to lifestyle change: Non3  Goals Established by Pt Goals  Cut out spam and processed snack foods  Increase whole plant based foods of fruits, vegetables and whole grains.  Drink 5 bottles per day   Get LDL Less than 70 mg/dl   MONITORING & EVALUATION Dietary intake, weekly physical activity,  in 1 mnth.  Next Steps  Patient is to work on meal planning and eating more whole plant based foods. Marland Kitchen

## 2022-10-17 NOTE — Patient Instructions (Addendum)
Goals  Cut out spam and processed snack foods  Increase whole plant based foods of fruits, vegetables and whole grains.  Drink 5 bottles per day   Get LDL Less than 70 mg/dl

## 2022-10-25 ENCOUNTER — Encounter: Payer: Self-pay | Admitting: Family Medicine

## 2022-10-28 ENCOUNTER — Ambulatory Visit (INDEPENDENT_AMBULATORY_CARE_PROVIDER_SITE_OTHER): Payer: Self-pay | Admitting: Family Medicine

## 2022-10-28 ENCOUNTER — Encounter: Payer: Self-pay | Admitting: Family Medicine

## 2022-10-28 VITALS — BP 107/80 | HR 64 | Temp 98.3°F | Ht 63.0 in | Wt 145.0 lb

## 2022-10-28 DIAGNOSIS — Z0001 Encounter for general adult medical examination with abnormal findings: Secondary | ICD-10-CM

## 2022-10-28 DIAGNOSIS — Z0000 Encounter for general adult medical examination without abnormal findings: Secondary | ICD-10-CM

## 2022-10-28 DIAGNOSIS — Z1211 Encounter for screening for malignant neoplasm of colon: Secondary | ICD-10-CM

## 2022-10-28 DIAGNOSIS — F439 Reaction to severe stress, unspecified: Secondary | ICD-10-CM

## 2022-10-28 DIAGNOSIS — F410 Panic disorder [episodic paroxysmal anxiety] without agoraphobia: Secondary | ICD-10-CM | POA: Insufficient documentation

## 2022-10-28 DIAGNOSIS — F411 Generalized anxiety disorder: Secondary | ICD-10-CM | POA: Insufficient documentation

## 2022-10-28 DIAGNOSIS — Z8249 Family history of ischemic heart disease and other diseases of the circulatory system: Secondary | ICD-10-CM | POA: Insufficient documentation

## 2022-10-28 MED ORDER — CITALOPRAM HYDROBROMIDE 40 MG PO TABS: 40.0000 mg | ORAL_TABLET | Freq: Every day | ORAL | 3 refills | Status: DC

## 2022-10-28 MED ORDER — BUSPIRONE HCL 10 MG PO TABS
5.0000 mg | ORAL_TABLET | Freq: Three times a day (TID) | ORAL | 3 refills | Status: DC
Start: 2022-10-28 — End: 2023-11-14

## 2022-10-28 NOTE — Patient Instructions (Signed)
Perimenopause Perimenopause is the normal time of a woman's life when the levels of estrogen, the female hormone produced by the ovaries, begin to decrease. This leads to changes in menstrual periods before they stop completely (menopause). Perimenopause can begin 2-8 years before menopause. During perimenopause, the ovaries may or may not produce an egg and a woman can still become pregnant. What are the causes? This condition is caused by a natural change in hormone levels that happens as you get older. What increases the risk? This condition is more likely to start at an earlier age if you have certain medical conditions or have undergone treatments, including: A tumor of the pituitary gland in the brain. A disease that affects the ovaries and hormone production. Certain cancer treatments, such as chemotherapy or hormone therapy, or radiation therapy on the pelvis. Heavy smoking and excessive alcohol use. Family history of early menopause. What are the signs or symptoms? Perimenopausal changes affect each woman differently. Symptoms of this condition may include: Hot flashes. Irregular menstrual periods. Night sweats. Changes in feelings about sex. This could be a decrease in sex drive or an increased discomfort around your sexuality. Vaginal dryness. Headaches. Mood swings. Depression. Problems sleeping (insomnia). Memory problems or trouble concentrating. Irritability. Tiredness. Weight gain. Anxiety. Trouble getting pregnant. How is this diagnosed? This condition is diagnosed based on your medical history, a physical exam, your age, your menstrual history, and your symptoms. Hormone tests may also be done. How is this treated? In some cases, no treatment is needed. You and your health care provider should make a decision together about whether treatment is necessary. Treatment will be based on your individual condition and preferences. Various treatments are available, such  as: Menopausal hormone therapy (MHT). Medicines to treat specific symptoms. Acupuncture. Vitamin or herbal supplements. Before starting treatment, make sure to let your health care provider know if you have a personal or family history of: Heart disease. Breast cancer. Blood clots. Diabetes. Osteoporosis. Follow these instructions at home: Medicines Take over-the-counter and prescription medicines only as told by your health care provider. Take vitamin supplements only as told by your health care provider. Talk with your health care provider before starting any herbal supplements. Lifestyle  Do not use any products that contain nicotine or tobacco, such as cigarettes, e-cigarettes, and chewing tobacco. If you need help quitting, ask your health care provider. Get at least 30 minutes of physical activity on 5 or more days each week. Eat a balanced diet that includes fresh fruits and vegetables, whole grains, soybeans, eggs, lean meat, and low-fat dairy. Avoid alcoholic and caffeinated beverages, as well as spicy foods. This may help prevent hot flashes. Get 7-8 hours of sleep each night. Dress in layers that can be removed to help you manage hot flashes. Find ways to manage stress, such as deep breathing, meditation, or journaling. General instructions  Keep track of your menstrual periods, including: When they occur. How heavy they are and how long they last. How much time passes between periods. Keep track of your symptoms, noting when they start, how often you have them, and how long they last. Use vaginal lubricants or moisturizers to help with vaginal dryness and improve comfort during sex. You can still become pregnant if you are having irregular periods. Make sure you use contraception during perimenopause if you do not want to get pregnant. Keep all follow-up visits. This is important. This includes any group therapy or counseling. Contact a health care provider if: You  have   heavy vaginal bleeding or pass blood clots. Your period lasts more than 2 days longer than normal. Your periods are recurring sooner than 21 days. You bleed after having sex. You have pain during sex. Get help right away if you have: Chest pain, trouble breathing, or trouble talking. Severe depression. Pain when you urinate. Severe headaches. Vision problems. Summary Perimenopause is the time when a woman's body begins to move into menopause. This may happen naturally or as a result of other health problems or medical treatments. Perimenopause can begin 2-8 years before menopause, and it can last for several years. Perimenopausal symptoms can be managed through medicines, lifestyle changes, and complementary therapies such as acupuncture. This information is not intended to replace advice given to you by your health care provider. Make sure you discuss any questions you have with your health care provider. Document Revised: 11/18/2019 Document Reviewed: 11/18/2019 Elsevier Patient Education  2023 Elsevier Inc.  
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
## 2022-11-01 ENCOUNTER — Other Ambulatory Visit: Payer: Self-pay

## 2022-11-01 DIAGNOSIS — E782 Mixed hyperlipidemia: Secondary | ICD-10-CM

## 2022-11-02 LAB — LIPID PANEL
Chol/HDL Ratio: 2.8 ratio (ref 0.0–4.4)
Cholesterol, Total: 179 mg/dL (ref 100–199)
HDL: 63 mg/dL (ref >39)
LDL Chol Calc (NIH): 106 mg/dL — ABNORMAL HIGH (ref 0–99)
Triglycerides: 53 mg/dL (ref 0–149)
VLDL Cholesterol Cal: 10 mg/dL (ref 5–40)

## 2022-11-04 ENCOUNTER — Telehealth: Payer: PRIVATE HEALTH INSURANCE | Admitting: Nurse Practitioner

## 2022-11-04 ENCOUNTER — Encounter: Payer: Self-pay | Admitting: Family Medicine

## 2022-11-04 DIAGNOSIS — R300 Dysuria: Secondary | ICD-10-CM | POA: Diagnosis not present

## 2022-11-04 MED ORDER — CEPHALEXIN 500 MG PO CAPS
500.0000 mg | ORAL_CAPSULE | Freq: Two times a day (BID) | ORAL | 0 refills | Status: AC
Start: 2022-11-04 — End: 2022-11-11

## 2022-11-04 NOTE — Progress Notes (Signed)

## 2022-11-05 ENCOUNTER — Encounter: Payer: Self-pay | Admitting: Family Medicine

## 2022-11-11 ENCOUNTER — Encounter: Payer: Self-pay | Admitting: Family Medicine

## 2022-11-13 ENCOUNTER — Encounter: Payer: Self-pay | Admitting: Family Medicine

## 2022-11-13 ENCOUNTER — Ambulatory Visit (INDEPENDENT_AMBULATORY_CARE_PROVIDER_SITE_OTHER): Payer: Self-pay | Admitting: Family Medicine

## 2022-11-13 ENCOUNTER — Other Ambulatory Visit: Payer: PRIVATE HEALTH INSURANCE

## 2022-11-13 VITALS — BP 128/82 | HR 71 | Temp 97.5°F | Ht 63.0 in | Wt 144.6 lb

## 2022-11-13 DIAGNOSIS — R300 Dysuria: Secondary | ICD-10-CM

## 2022-11-13 DIAGNOSIS — Z202 Contact with and (suspected) exposure to infections with a predominantly sexual mode of transmission: Secondary | ICD-10-CM

## 2022-11-13 LAB — URINALYSIS, COMPLETE
Bilirubin, UA: NEGATIVE
Glucose, UA: NEGATIVE
Ketones, UA: NEGATIVE
Nitrite, UA: NEGATIVE
Protein,UA: NEGATIVE
Specific Gravity, UA: 1.01 (ref 1.005–1.030)
Urobilinogen, Ur: 0.2 mg/dL (ref 0.2–1.0)
pH, UA: 7 (ref 5.0–7.5)

## 2022-11-13 LAB — MICROSCOPIC EXAMINATION

## 2022-11-13 MED ORDER — DOXYCYCLINE HYCLATE 100 MG PO TABS: 100.0000 mg | ORAL_TABLET | Freq: Two times a day (BID) | ORAL | 0 refills | Status: AC

## 2022-11-13 MED ORDER — CEFTRIAXONE SODIUM 500 MG IJ SOLR
500.0000 mg | Freq: Once | INTRAMUSCULAR | Status: AC
Start: 2022-11-13 — End: 2022-11-13
  Administered 2022-11-13: 500 mg via INTRAMUSCULAR
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
## 2022-11-15 LAB — NUSWAB VAGINITIS PLUS (VG+)
Atopobium vaginae: HIGH Score — AB
Candida albicans, NAA: NEGATIVE
Candida glabrata, NAA: NEGATIVE
Chlamydia trachomatis, NAA: NEGATIVE
Megasphaera 1: HIGH Score — AB
Neisseria gonorrhoeae, NAA: NEGATIVE
Trich vag by NAA: NEGATIVE

## 2022-11-15 LAB — URINE CULTURE

## 2022-11-18 ENCOUNTER — Encounter: Payer: Self-pay | Admitting: Family Medicine

## 2022-11-19 NOTE — Telephone Encounter (Signed)
Results indicate that there was E coli UTI, which was susceptible to ceftriaxone. Patient was treated with rocephin (ceftriaxone) during her office visit. If symptoms are persisting, we can do additional testing to see make sure UTI has cleared. The test was indeterminate for bacterial vaginosis and would require additional testing. Again, if still having symptoms, could come back in and we can test for BV, but at this time do not think she needs to complete another round of antibiotics unless she is experiencing symptoms.

## 2022-11-25 NOTE — Progress Notes (Signed)
Positive for E. Coli UTI, which should have been treated by Rocephin injection provided in office. If still having symptoms can treat for BV as the test was indeterminate.

## 2022-11-27 ENCOUNTER — Ambulatory Visit: Payer: No Typology Code available for payment source | Admitting: Nutrition

## 2022-12-03 ENCOUNTER — Ambulatory Visit: Payer: PRIVATE HEALTH INSURANCE

## 2022-12-18 ENCOUNTER — Other Ambulatory Visit: Payer: Self-pay

## 2022-12-18 ENCOUNTER — Telehealth: Payer: Self-pay | Admitting: "Endocrinology

## 2022-12-18 DIAGNOSIS — E042 Nontoxic multinodular goiter: Secondary | ICD-10-CM

## 2022-12-18 NOTE — Telephone Encounter (Signed)
New order for thyroid ultrasound placed.

## 2022-12-18 NOTE — Telephone Encounter (Signed)
Pt called to schedule ultrasound and Lori Koch told her [place] order had been closed out and that Dr. Fransico Him needed to send a new one.

## 2022-12-23 ENCOUNTER — Encounter: Payer: Self-pay | Admitting: Nutrition

## 2022-12-23 ENCOUNTER — Encounter: Payer: PRIVATE HEALTH INSURANCE | Attending: Family Medicine | Admitting: Nutrition

## 2022-12-23 ENCOUNTER — Other Ambulatory Visit: Payer: Self-pay | Admitting: "Endocrinology

## 2022-12-23 VITALS — Wt 145.0 lb

## 2022-12-23 DIAGNOSIS — E042 Nontoxic multinodular goiter: Secondary | ICD-10-CM

## 2022-12-23 DIAGNOSIS — R5383 Other fatigue: Secondary | ICD-10-CM

## 2022-12-23 DIAGNOSIS — E785 Hyperlipidemia, unspecified: Secondary | ICD-10-CM

## 2022-12-23 DIAGNOSIS — E539 Vitamin B deficiency, unspecified: Secondary | ICD-10-CM

## 2022-12-23 DIAGNOSIS — E039 Hypothyroidism, unspecified: Secondary | ICD-10-CM

## 2022-12-23 DIAGNOSIS — E559 Vitamin D deficiency, unspecified: Secondary | ICD-10-CM

## 2022-12-23 DIAGNOSIS — E782 Mixed hyperlipidemia: Secondary | ICD-10-CM | POA: Insufficient documentation

## 2022-12-23 NOTE — Patient Instructions (Addendum)
  Goals Established by Pt Keep up the great job! Focus on more whole plant based foods Aim for 25 grams of fiber per day. Eat pickle when craving something salty instead of potato chips Walk 30 minutes a day when able Set meal schedule so you have time to eat lunch during the day. Dont' skip meals. Get LDL Less than 70. You don't need to test blood sugars unless you feel odd or different.

## 2022-12-23 NOTE — Progress Notes (Signed)
Medical Nutrition Therapy  Appointment Start time:  0800  Appointment End time:  0830  Primary concerns today: CVD, Hyperlipidemia Referral diagnosis: E78.2 Preferred learning style: NO preference  Learning readiness: Ready    NUTRITION ASSESSMENT  Dm Type 2 Follow up  47 yr old wfemale here for hyperlipidemia. PMH: RA, Hyperlipiemia, Hypothyroid. Referred by her PCP. Endocrinologist is Dr. Fransico Him for her Thyroid. Hyperlipidemia: TCHOL 220, TG 94 mg/dl. LDL 132 mg/dl. VLDL 16. No new labs yet   Goals previously set: Goals  Cut out spam and processed snack foods-done  Increase whole plant based foods of fruits, vegetables and whole grains.-in process  Drink 5 bottles per day-done   Get LDL Less than 70 mg/dl-no new labs yet   Changes made: Eating more fruits, vegetables and whole grains. Has cut back on her weakness of potato chips. Has cut out eating a lot of processed foods of spam and other processed meats. Has cut out a lot of saturated fats and increased high fiber foods. Choosing lean protein. Husband is willing to eat healthier for support.  Willing to start walking more. BMI 25. 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 Wiling to work on a more consistent meal schedule. Has had some symptoms of low blood sugars when she ends up skipping a meal. Had 6 weeks of steroids from her RA Flare up in her left hand. Drinking only water now-7 bottles per day FBS 80-100's. Bedtime 130's. NO changes in weight. Feels better overall. Not as tired. Has more energy. RA is somewhat better. Doesn't know what flared up her left hand.  Willing to continue to work on [employer] based foods.   Anthropometrics   Wt Readings from Last 3 Encounters:  12/23/22 145 lb (65.8 kg)  11/13/22 144 lb 9.6 oz (65.6 kg)  10/28/22 145 lb (65.8 kg)   Ht Readings from Last 3 Encounters:  11/13/22 5\' 3"  (1.6 m)  10/28/22 5\' 3"  (1.6 m)  10/17/22 5\' 3"  (1.6 m)   Body mass index  is 25.69 kg/m. @BMIFA @ Facility age limit for growth %iles is 20 years. Facility age limit for growth %iles is 20 years.  Clinical Medical Hx: RA, Hypothyroidism, Hyperlipidemia Medications: see chart Labs:      Latest Ref Rng & Units 07/10/2022    8:10 AM 06/27/2022    8:22 AM 10/28/2017    9:16 AM  CMP  Glucose 70 - 99 mg/dL 85  82  82   BUN 6 - 24 mg/dL 14  21  17    Creatinine 0.57 - 1.00 mg/dL 1.61  0.96  0.45   Sodium 134 - 144 mmol/L 139  141  138   Potassium 3.5 - 5.2 mmol/L 4.4  4.2  4.2   Chloride 96 - 106 mmol/L 104  105  102   CO2 20 - 29 mmol/L 21  22  22    Calcium 8.7 - 10.2 mg/dL 8.9  8.8  8.9   Total Protein 6.0 - 8.5 g/dL 6.2  6.3  6.3   Total Bilirubin 0.0 - 1.2 mg/dL 0.3  0.3  0.3   Alkaline Phos 44 - 121 IU/L 47  54  63   AST 0 - 40 IU/L 18  16  18    ALT 0 - 32 IU/L 22  18  19     Lipid Panel  TCHOL 220, TG 94 mg/dl, HDL 72 mg/dl, VLDL 16 mg/dl and LDL 409 mg/dl  Notable  Signs/Symptoms: None  Lifestyle & Dietary Hx 47 yr old wfemale married.  Estimated daily fluid intake: 40  sodas oz Supplements:  Sleep: varies Stress / self-care:  Current average weekly physical activity: cares for paraplegic.  24-Hr Dietary Recall B) white whites and vegetables and fruit, L) skipped; usually has a salad she makes from home. Dinner:  chicken, vegetables and salad Water-7 bottles per day  Estimated Energy Needs Calories: 1500 Carbohydrate: 170g Protein: 112g Fat: 42g   NUTRITION DIAGNOSIS  NI-5.6.2 Excessive fat intake As related to high processed foods in diet.  As evidenced by LDL 132 mg/dl.   NUTRITION INTERVENTION  Nutrition education (E-1) on the following topics:  Nutrition and Diabetes education provided on My Plate, CHO counting, meal planning, portion sizes, timing of meals, avoiding snacks between meals unless having a low blood sugar, target ranges for A1C and blood sugars, signs/symptoms and treatment of hyper/hypoglycemia, monitoring blood  sugars, taking medications as prescribed, benefits of exercising 30 minutes per day and prevention of complications of DM. High Fiber diet  Handouts Provided Include  Lifestyle Medicien  Learning Style & Readiness for Change Teaching method utilized: Visual & Auditory  Demonstrated degree of understanding via: Teach Back  Barriers to learning/adherence to lifestyle change: Non3  Goals Established by Pt  Goals Established by Pt Keep up the great job! Focus on more whole plant based foods Aim for 25 grams of fiber per day. Eat pickle when craving something salty instead of potato chips Walk 30 minutes a day when able Set meal schedule so you have time to eat lunch during the day. Dont' skip meals. Get LDL Less than 70. You don't need to test blood sugars unless you feel odd or different.   MONITORING & EVALUATION Dietary intake, weekly physical activity,  in 1 mnth.  Next Steps  Patient is to work on meal planning and eating more whole plant based foods. Marland Kitchen

## 2022-12-31 ENCOUNTER — Ambulatory Visit (HOSPITAL_COMMUNITY)
Admission: RE | Admit: 2022-12-31 | Discharge: 2022-12-31 | Disposition: A | Discharge status: Home / Self Care | Payer: No Typology Code available for payment source | Source: Ambulatory Visit | Attending: "Endocrinology | Admitting: "Endocrinology

## 2022-12-31 DIAGNOSIS — E042 Nontoxic multinodular goiter: Secondary | ICD-10-CM | POA: Diagnosis present

## 2023-01-01 ENCOUNTER — Encounter: Payer: Self-pay | Admitting: Family Medicine

## 2023-01-03 NOTE — Telephone Encounter (Signed)
Can you check with lab on this?  I do not see where any FOBT was processed.

## 2023-01-07 NOTE — Telephone Encounter (Signed)
Can you please check with Lab to see if they maybe received something and there was a prob processing?

## 2023-01-11 ENCOUNTER — Other Ambulatory Visit: Payer: Self-pay | Admitting: "Endocrinology

## 2023-01-21 ENCOUNTER — Other Ambulatory Visit (HOSPITAL_COMMUNITY): Payer: PRIVATE HEALTH INSURANCE

## 2023-01-23 ENCOUNTER — Other Ambulatory Visit: Payer: Self-pay

## 2023-01-28 ENCOUNTER — Encounter: Payer: Self-pay | Admitting: "Endocrinology

## 2023-01-28 ENCOUNTER — Ambulatory Visit (INDEPENDENT_AMBULATORY_CARE_PROVIDER_SITE_OTHER): Payer: Self-pay | Admitting: "Endocrinology

## 2023-01-28 VITALS — BP 102/78 | HR 64 | Ht 63.0 in | Wt 144.6 lb

## 2023-01-28 DIAGNOSIS — E039 Hypothyroidism, unspecified: Secondary | ICD-10-CM

## 2023-01-28 DIAGNOSIS — E539 Vitamin B deficiency, unspecified: Secondary | ICD-10-CM

## 2023-01-28 MED ORDER — LEVOTHYROXINE SODIUM 25 MCG PO TABS: 37.5000 ug | ORAL_TABLET | Freq: Every day | ORAL | 1 refills | Status: DC

## 2023-01-28 NOTE — Progress Notes (Signed)
01/28/2023        Endocrinology follow-up note    Subjective:    Patient ID: Lori Koch, female    DOB: 22-Feb-1976, PCP Lori Ip, DO   Past Medical History:  Diagnosis Date   Diabetes mellitus without complication (HCC) 2019   diet only   Thyroid disease    hypothyroidism   Past Surgical History:  Procedure Laterality Date   TUBAL LIGATION  1998   Social History   Socioeconomic History   Marital status: Married    Spouse name: Not on file   Number of children: 3   Years of education: Not on file   Highest education level: GED or equivalent  Occupational History   Not on file  Tobacco Use   Smoking status: Former    Current packs/day: 0.00    Average packs/day: 0.5 packs/day for 10.0 years (5.0 ttl pk-yrs)    Types: Cigarettes    Start date: 17    Quit date: 47    Years since quitting: 29.6   Smokeless tobacco: Never  Vaping Use   Vaping status: Never Used  Substance and Sexual Activity   Alcohol use: No   Drug use: No   Sexual activity: Yes    Birth control/protection: None  Other Topics Concern   Not on file  Social History Narrative   Not on file   Social Determinants of Health   Financial Resource Strain: Not on file  Food Insecurity: No Food Insecurity (07/26/2021)   Hunger Vital Sign    Worried About Running Out of Food in the Last Year: Never true    Ran Out of Food in the Last Year: Never true  Transportation Needs: No Transportation Needs (07/26/2021)   PRAPARE - Administrator, Civil Service (Medical): No    Lack of Transportation (Non-Medical): No  Physical Activity: Not on file  Stress: Not on file  Social Connections: Unknown (10/29/2021)   Received from [facility]   Social Network    Social Network: Not on file   Outpatient Encounter Medications as of 01/28/2023  Medication Sig   Cyanocobalamin (VITAMIN B 12) 500 MCG TABS Take 500 mcg by mouth daily with supper.    leflunomide (ARAVA) 20 MG tablet Take 20 mg by mouth daily.   busPIRone (BUSPAR) 10 MG tablet Take 0.5-1 tablets (5-10 mg total) by mouth 3 (three) times daily. For anxiety   citalopram (CELEXA) 40 MG tablet Take 1 tablet (40 mg total) by mouth daily.   levothyroxine (SYNTHROID) 25 MCG tablet Take 1.5 tablets (37.5 mcg total) by mouth daily before breakfast.   Upadacitinib ER (RINVOQ) 15 MG TB24 Take 1 tablet by mouth daily in the afternoon.   [DISCONTINUED] levothyroxine (SYNTHROID) 25 MCG tablet TAKE 1.5 TABLETS (37.5 MCG TOTAL) BY MOUTH DAILY BEFORE BREAKFAST.   No facility-administered encounter medications on file as of 01/28/2023.   ALLERGIES: Allergies  Allergen Reactions   Sulfa Antibiotics Hives    VACCINATION STATUS: Immunization History  Administered Date(s) Administered   Influenza Inj Mdck Quad Pf 06/04/2018   Influenza,inj,Quad PF,6+ Mos 06/04/2017, 04/19/2019, 03/26/2022   Influenza-Unspecified 06/04/2018   PFIZER Comirnaty(Gray Top)Covid-19 Tri-Sucrose Vaccine 08/02/2020   PPD Test 03/22/2019, 03/11/2022    HPI Lori Koch is 47 y.o. female who presents today with a medical history as above. she is being seen in follow-up for hypothyroidism, multinodular goiter.  -She is currently on levothyroxine 37.5 mcg p.o.  daily before breakfast.   She continues to feel better, reports compliance to her medication.    -She has no new complaints today.  She admits to dietary inconsistencies and lack of regulated sleep time. - She says she was diagnosed with hypothyroidism in 2011.  - She is also known to have nodular goiter, previsit thyroid ultrasound is significant for similar findings compared to prior studies.  Previous  thyroid ultrasound shows overall decrease in size of bilateral thyroid lobes and decrease in size of thyroid nodule in the right lobe to 1.5 cm from 1.7 cm.  This nodule had no suspicious features.  Her previsit thyroid ultrasound documents similar  findings. -In the interim, she was diagnosed with rheumatoid arthritis which required brief intervention with steroids which caused unintended weight gain of 5 pounds.      She has diet controlled type 2 diabetes with a reported A1c of 4.5%. -She denies palpitations, hot/cold intolerance. She denies dysphagia, shortness of breath, nor voice change. -She denies exposure to neck radiation. - She is a former smoker. - She has 3 grown kids status post tubal ligation in 1998.  Review of Systems Limited as above.  Objective:    BP 102/78   Pulse 64   Ht 5\' 3"  (1.6 m)   Wt 144 lb 9.6 oz (65.6 kg)   BMI 25.61 kg/m   Wt Readings from Last 3 Encounters:  01/28/23 144 lb 9.6 oz (65.6 kg)  12/23/22 145 lb (65.8 kg)  11/13/22 144 lb 9.6 oz (65.6 kg)    Physical Exam    Physical Exam- Limited  Constitutional:  Body mass index is 25.61 kg/m. , not in acute distress, normal state of mind   TSH from 03/10/2017 was 1.85 Recent Results (from the past 2160 hour(s))  Lipid panel     Status: Abnormal   Collection Time: 11/01/22  8:22 AM  Result Value Ref Range   Cholesterol, Total 179 100 - 199 mg/dL   Triglycerides 53 0 - 149 mg/dL   HDL 63 >81 mg/dL   VLDL Cholesterol Cal 10 5 - 40 mg/dL   LDL Chol Calc (NIH) 191 (H) 0 - 99 mg/dL   Chol/HDL Ratio 2.8 0.0 - 4.4 ratio    Comment:                                   T. Chol/HDL Ratio                                             Men  Women                               1/2 Avg.Risk  3.4    3.3                                   Avg.Risk  5.0    4.4                                2X Avg.Risk  9.6    7.1  3X Avg.Risk 23.4   11.0   Urinalysis, Complete     Status: Abnormal   Collection Time: 11/13/22  8:35 AM  Result Value Ref Range   Specific Gravity, UA 1.010 1.005 - 1.030   pH, UA 7.0 5.0 - 7.5   Color, UA Yellow Yellow   Appearance Ur Clear Clear   Leukocytes,UA 2+ (A) Negative   Protein,UA Negative  Negative/Trace   Glucose, UA Negative Negative   Ketones, UA Negative Negative   RBC, UA 3+ (A) Negative   Bilirubin, UA Negative Negative   Urobilinogen, Ur 0.2 0.2 - 1.0 mg/dL   Nitrite, UA Negative Negative   Microscopic Examination See below:   Microscopic Examination     Status: Abnormal   Collection Time: 11/13/22  8:35 AM   Urine  Result Value Ref Range   WBC, UA 11-30 (A) 0 - 5 /hpf   RBC, Urine 0-2 0 - 2 /hpf   Epithelial Cells (non renal) 0-10 0 - 10 /hpf   Bacteria, UA Few None seen/Few  Urine Culture     Status: Abnormal   Collection Time: 11/13/22 10:47 AM   Specimen: Urine   UA  Result Value Ref Range   Urine Culture, Routine Final report (A)    Organism ID, Bacteria Comment (A)     Comment: Escherichia coli, identified by an automated biochemical system. Cefazolin <=4 ug/mL Cefazolin with an MIC <=16 predicts susceptibility to the oral agents cefaclor, cefdinir, cefpodoxime, cefprozil, cefuroxime, cephalexin, and loracarbef when used for therapy of uncomplicated urinary tract infections due to E. coli, Klebsiella pneumoniae, and Proteus mirabilis. 10,000-25,000 colony forming units per mL    Antimicrobial Susceptibility Comment     Comment:       ** S = Susceptible; I = Intermediate; R = Resistant **                    P = Positive; N = Negative             MICS are expressed in micrograms per mL    Antibiotic                 RSLT#1    RSLT#2    RSLT#3    RSLT#4 Amoxicillin/Clavulanic Acid    I Ampicillin                     R Cefepime                       S Ceftriaxone                    S Cefuroxime                     S Ciprofloxacin                  R Ertapenem                      S Gentamicin                     S Imipenem                       S Levofloxacin                   R Meropenem  S Nitrofurantoin                 S Piperacillin/Tazobactam        S Tetracycline                   S Tobramycin                      S Trimethoprim/Sulfa             S   NuSwab Vaginitis Plus (VG+)     Status: Abnormal   Collection Time: 11/13/22 11:55 AM  Result Value Ref Range   Atopobium vaginae High - 2 (A) Score   BVAB 2 Low - 0 Score   Megasphaera 1 High - 2 (A) Score    Comment: Calculate total score by adding the 3 individual bacterial vaginosis (BV) marker scores together.  Total score is interpreted as follows: Total score 0-1: Indicates the absence of BV. Total score   2: Indeterminate for BV. Additional clinical                  data should be evaluated to establish a                  diagnosis. Total score 3-6: Indicates the presence of BV.    Candida albicans, NAA Negative Negative   Candida glabrata, NAA Negative Negative   Trich vag by NAA Negative Negative   Chlamydia trachomatis, NAA Negative Negative   Neisseria gonorrhoeae, NAA Negative Negative  Cortisol-am, blood     Status: None   Collection Time: 01/23/23  8:36 AM  Result Value Ref Range   Cortisol - AM 14.3 6.2 - 19.4 ug/dL  Comprehensive metabolic panel     Status: None   Collection Time: 01/23/23  8:36 AM  Result Value Ref Range   Glucose 84 70 - 99 mg/dL   BUN 14 6 - 24 mg/dL   Creatinine, Ser 8.29 0.57 - 1.00 mg/dL   eGFR 75 >56 OZ/HYQ/6.57   BUN/Creatinine Ratio 15 9 - 23   Sodium 138 134 - 144 mmol/L   Potassium 4.0 3.5 - 5.2 mmol/L   Chloride 105 96 - 106 mmol/L   CO2 21 20 - 29 mmol/L   Calcium 9.1 8.7 - 10.2 mg/dL   Total Protein 6.2 6.0 - 8.5 g/dL   Albumin 4.2 3.9 - 4.9 g/dL   Globulin, Total 2.0 1.5 - 4.5 g/dL   Bilirubin Total 0.5 0.0 - 1.2 mg/dL   Alkaline Phosphatase 51 44 - 121 IU/L   AST 18 0 - 40 IU/L   ALT 15 0 - 32 IU/L  VITAMIN D 25 Hydroxy (Vit-D Deficiency, Fractures)     Status: None   Collection Time: 01/23/23  8:36 AM  Result Value Ref Range   Vit D, 25-Hydroxy 43.5 30.0 - 100.0 ng/mL    Comment: Vitamin D deficiency has been defined by the Institute of Medicine and an Endocrine Society practice  guideline as a level of serum 25-OH vitamin D less than 20 ng/mL (1,2). The Endocrine Society went on to further define vitamin D insufficiency as a level between 21 and 29 ng/mL (2). 1. IOM (Institute of Medicine). 2010. Dietary reference    intakes for calcium and D. [place]: The    [employer]. 2. Holick MF, Binkley [employer], Bischoff-Ferrari HA, et al.    Evaluation, treatment, and prevention of vitamin D    deficiency: an  Endocrine Society clinical practice    guideline. JCEM. 2011 Jul; 96(7):1911-30.   Vitamin B12     Status: Abnormal   Collection Time: 01/23/23  8:36 AM  Result Value Ref Range   Vitamin B-12 208 (L) 232 - 1,245 pg/mL  T4, Free     Status: None   Collection Time: 01/23/23  8:36 AM  Result Value Ref Range   Free T4 1.27 0.82 - 1.77 ng/dL  TSH     Status: None   Collection Time: 01/23/23  8:36 AM  Result Value Ref Range   TSH 2.210 0.450 - 4.500 uIU/mL   Thyroid ultrasound from 04/25/2017 showed right lobe 4.6 cm with 1.5 cm nodule; left lobe 3.3 cm with no nodules.   Thyroid ultrasound on May 04, 2018: Right lobe decrease in size to 3.6 cm from four-point recliner, left lobe decreased to 3.1 cm from 3.3 cm.  1.5 cm nodule on the right lobe, previously 1.7 cm.  Thyroid ultrasound on May 17, 2019 -Confirm 2-year stability of 1.6 cm category 3 nodule in the right medial gland.  Recommend continued follow-up every 1-2 years until 5-year stability has been confirmed.   Thyroid ultrasound on Nov 10, 2020:  IMPRESSION: 1. Nodule 1 located in the mid right thyroid lobe is not significantly changed in size since 04/25/2017. Follow-up ultrasound should be performed to document 5 years of stability. 2. Nodule 3 located in the left inferior thyroid lobe is not significantly changed in size since 05/17/2019. This meets criteria for surveillance. Follow-up thyroid ultrasound should be performed in 1 year.  Assessment & Plan:   1.  Hypothyroidism 2. Thyroid nodule 3.  Vitamin B12 deficiency -Her previsit thyroid function tests are consistent with appropriate replacement.   She is advised to continue levothyroxine 37.5 mcg p.o. daily before breakfast.    - We discussed about the correct intake of her thyroid hormone, on empty stomach at fasting, with water, separated by at least 30 minutes from breakfast and other medications,  and separated by more than 4 hours from calcium, iron, multivitamins, acid reflux medications (PPIs). -Patient is made aware of the fact that thyroid hormone replacement is needed for life, dose to be adjusted by periodic monitoring of thyroid function tests.  - Regarding 1.5 cm nodule on the right lobe and 0.9 cm nodule in the left lobe with no suspicious features, she will not need any intervention at this time.  Her previsit thyroid ultrasound is unremarkable-see above.    Her previsit labs show normal cortisol, favorable CMP, improved lipid panel, and vitamin B12 deficiency.  She is advised to start vitamin B12 500 mcg p.o. daily from over-the-counter.     - I advised patient to maintain close follow up with Lori Ip, DO for primary care needs.   I spent  22  minutes in the care of the patient today including review of labs from Thyroid Function, CMP, and other relevant labs ; imaging/biopsy records (current and previous including abstractions from other facilities); face-to-face time discussing  her lab results and symptoms, medications doses, her options of short and long term treatment based on the latest standards of care / guidelines;   and documenting the encounter.  Lori Koch  participated in the discussions, expressed understanding, and voiced agreement with the above plans.  All questions were answered to her satisfaction. she is encouraged to contact clinic should she have any questions or concerns prior to her return visit.    Follow up  plan: Return in about 6 months  (around 07/31/2023) for Fasting Labs  in AM B4 8.  Marquis Lunch, MD [facility] Phone: [phone]  Fax: [phone]   01/28/2023, 9:54 AM This note was partially dictated with voice recognition software. Similar sounding words can be transcribed inadequately or may not  be corrected upon review.

## 2023-02-21 ENCOUNTER — Telehealth (INDEPENDENT_AMBULATORY_CARE_PROVIDER_SITE_OTHER): Payer: Self-pay | Admitting: Family Medicine

## 2023-02-21 ENCOUNTER — Encounter: Payer: Self-pay | Admitting: Family Medicine

## 2023-02-21 DIAGNOSIS — K130 Diseases of lips: Secondary | ICD-10-CM | POA: Diagnosis not present

## 2023-02-21 DIAGNOSIS — L209 Atopic dermatitis, unspecified: Secondary | ICD-10-CM

## 2023-02-21 MED ORDER — TRIAMCINOLONE ACETONIDE 0.025 % EX CREA
1.0000 | TOPICAL_CREAM | Freq: Two times a day (BID) | CUTANEOUS | 0 refills | Status: AC
Start: 2023-02-21 — End: ?

## 2023-02-21 NOTE — Progress Notes (Signed)
MyChart Video visit  Subjective: WC:[id number] lips PCP: Raliegh Ip, DO VOH:YWVP C [place] is a 47 y.o. female. Patient provides verbal consent for consult held via video.  Due to COVID-19 pandemic this visit was conducted virtually. This visit type was conducted due to national recommendations for restrictions regarding the COVID-19 Pandemic (e.g. social distancing, sheltering in place) in an effort to limit this patient's exposure and mitigate transmission in our community. All issues noted in this document were discussed and addressed.  A physical exam was not performed with this format.   Location of patient: Work Location of provider: [facility] Others present for call: None  1.  Rash Patient reports that she actually has a red, itchy rash that developed above her lips over the last couple of days.  Denies any sunburn or known changes.  She uses Chapstick fairly regularly but has done this for years.  Has history of eczema.   ROS: Per HPI  Allergies  Allergen Reactions   Sulfa Antibiotics Hives   Past Medical History:  Diagnosis Date   Diabetes mellitus without complication (HCC) 2019   diet only   Thyroid disease    hypothyroidism    Current Outpatient Medications:    busPIRone (BUSPAR) 10 MG tablet, Take 0.5-1 tablets (5-10 mg total) by mouth 3 (three) times daily. For anxiety, Disp: 270 tablet, Rfl: 3   citalopram (CELEXA) 40 MG tablet, Take 1 tablet (40 mg total) by mouth daily., Disp: 90 tablet, Rfl: 3   Cyanocobalamin (VITAMIN B 12) 500 MCG TABS, Take 500 mcg by mouth daily with supper., Disp: , Rfl:    leflunomide (ARAVA) 20 MG tablet, Take 20 mg by mouth daily., Disp: , Rfl:    levothyroxine (SYNTHROID) 25 MCG tablet, Take 1.5 tablets (37.5 mcg total) by mouth daily before breakfast., Disp: 135 tablet, Rfl: 1   Upadacitinib ER (RINVOQ) 15 MG TB24, Take 1 tablet by mouth daily in the afternoon., Disp: , Rfl:   Gen: Nontoxic-appearing female.  She has erythematous  and slightly swollen dry skin along the vermilion border.  No signs of secondary bacterial infection  Assessment/ Plan: 47 y.o. female   Cheilitis due to atopic dermatitis - Plan: triamcinolone (KENALOG) 0.025 % cream  Symptoms are consistent with cheilitis.  I have advised her to avoid any topicals including Chapstick's, fragrance, facial products.  Will treat with low-dose corticosteroid apply to the affected areas for the next few days.  She will contact me if she develops any secondary signs of bacterial infection  Start time: 12:52p End time: 12:56pm  Total time spent on patient care (including video visit/ documentation): 5 minutes  Abigaelle Verley Hulen Skains, DO [facility] [phone]

## 2023-03-05 ENCOUNTER — Encounter: Payer: Self-pay | Admitting: Family Medicine

## 2023-04-09 ENCOUNTER — Other Ambulatory Visit (HOSPITAL_COMMUNITY): Payer: Self-pay | Admitting: Family Medicine

## 2023-04-09 DIAGNOSIS — Z1231 Encounter for screening mammogram for malignant neoplasm of breast: Secondary | ICD-10-CM

## 2023-04-10 ENCOUNTER — Telehealth: Payer: Self-pay | Admitting: Physician Assistant

## 2023-04-10 DIAGNOSIS — R3989 Other symptoms and signs involving the genitourinary system: Secondary | ICD-10-CM

## 2023-04-10 MED ORDER — CEPHALEXIN 500 MG PO CAPS: 500.0000 mg | ORAL_CAPSULE | Freq: Two times a day (BID) | ORAL | 0 refills | Status: AC

## 2023-04-10 NOTE — Progress Notes (Signed)
I have spent 5 minutes in review of e-visit questionnaire, review and updating patient chart, medical decision making and response to patient.   Mia Milan Cody Jacklynn Dehaas, PA-C    

## 2023-04-10 NOTE — Progress Notes (Signed)

## 2023-04-16 ENCOUNTER — Other Ambulatory Visit: Payer: Self-pay

## 2023-04-17 ENCOUNTER — Ambulatory Visit (HOSPITAL_COMMUNITY)
Admission: RE | Admit: 2023-04-17 | Discharge: 2023-04-17 | Disposition: A | Discharge status: Home / Self Care | Payer: No Typology Code available for payment source | Source: Ambulatory Visit | Attending: Family Medicine | Admitting: Family Medicine

## 2023-04-17 ENCOUNTER — Encounter: Payer: Self-pay | Admitting: Family Medicine

## 2023-04-17 ENCOUNTER — Encounter (HOSPITAL_COMMUNITY): Payer: Self-pay

## 2023-04-17 DIAGNOSIS — Z1231 Encounter for screening mammogram for malignant neoplasm of breast: Secondary | ICD-10-CM | POA: Insufficient documentation

## 2023-07-18 ENCOUNTER — Other Ambulatory Visit: Payer: Commercial Managed Care - HMO

## 2023-07-19 LAB — VITAMIN B12: Vitamin B-12: 712 pg/mL (ref 232–1245)

## 2023-07-19 LAB — LIPID PANEL
Chol/HDL Ratio: 3.2 {ratio} (ref 0.0–4.4)
Cholesterol, Total: 185 mg/dL (ref 100–199)
HDL: 58 mg/dL (ref >39)
LDL Chol Calc (NIH): 112 mg/dL — ABNORMAL HIGH (ref 0–99)
Triglycerides: 81 mg/dL (ref 0–149)
VLDL Cholesterol Cal: 15 mg/dL (ref 5–40)

## 2023-07-19 LAB — T4, FREE: Free T4: 1.27 ng/dL (ref 0.82–1.77)

## 2023-07-19 LAB — TSH: TSH: 1.38 u[IU]/mL (ref 0.450–4.500)

## 2023-07-21 ENCOUNTER — Other Ambulatory Visit: Payer: Self-pay

## 2023-07-31 ENCOUNTER — Ambulatory Visit: Payer: Commercial Managed Care - HMO | Admitting: "Endocrinology

## 2023-08-14 ENCOUNTER — Other Ambulatory Visit: Payer: Self-pay | Admitting: "Endocrinology

## 2023-08-15 ENCOUNTER — Other Ambulatory Visit: Payer: Self-pay | Admitting: "Endocrinology

## 2023-08-15 MED ORDER — LEVOTHYROXINE SODIUM 25 MCG PO TABS: 37.5000 ug | ORAL_TABLET | Freq: Every day | ORAL | 0 refills | Status: DC

## 2023-08-15 NOTE — Telephone Encounter (Signed)
 Any contraindication to changing manufacturer.?

## 2023-08-26 ENCOUNTER — Ambulatory Visit: Payer: Commercial Managed Care - HMO | Admitting: "Endocrinology

## 2023-08-28 ENCOUNTER — Encounter: Payer: Self-pay | Admitting: "Endocrinology

## 2023-08-28 ENCOUNTER — Ambulatory Visit (INDEPENDENT_AMBULATORY_CARE_PROVIDER_SITE_OTHER): Admitting: "Endocrinology

## 2023-08-28 VITALS — BP 114/68 | HR 68 | Ht 63.0 in | Wt 145.0 lb

## 2023-08-28 DIAGNOSIS — E785 Hyperlipidemia, unspecified: Secondary | ICD-10-CM

## 2023-08-28 DIAGNOSIS — E039 Hypothyroidism, unspecified: Secondary | ICD-10-CM

## 2023-08-28 DIAGNOSIS — E042 Nontoxic multinodular goiter: Secondary | ICD-10-CM | POA: Diagnosis not present

## 2023-08-28 NOTE — Progress Notes (Signed)
 08/28/2023        Endocrinology follow-up note   Subjective:    Patient ID: Lori Koch, female    DOB: October 08, 1975, PCP Raliegh Ip, DO   Past Medical History:  Diagnosis Date   Diabetes mellitus without complication (HCC) 2019   diet only   Thyroid disease    hypothyroidism   Past Surgical History:  Procedure Laterality Date   BREAST BIOPSY Left 07/30/2021   FIBROADENOMATOID NODULE   TUBAL LIGATION  1998   Social History   Socioeconomic History   Marital status: Married    Spouse name: Not on file   Number of children: 3   Years of education: Not on file   Highest education level: GED or equivalent  Occupational History   Not on file  Tobacco Use   Smoking status: Former    Current packs/day: 0.00    Average packs/day: 0.5 packs/day for 10.0 years (5.0 ttl pk-yrs)    Types: Cigarettes    Start date: 71    Quit date: 1995    Years since quitting: 30.2   Smokeless tobacco: Never  Vaping Use   Vaping status: Never Used  Substance and Sexual Activity   Alcohol use: No   Drug use: No   Sexual activity: Yes    Birth control/protection: None  Other Topics Concern   Not on file  Social History Narrative   Not on file   Social Drivers of Health   Financial Resource Strain: Not on file  Food Insecurity: No Food Insecurity (07/26/2021)   Hunger Vital Sign    Worried About Running Out of Food in the Last Year: Never true    Ran Out of Food in the Last Year: Never true  Transportation Needs: No Transportation Needs (07/26/2021)   PRAPARE - Administrator, Civil Service (Medical): No    Lack of Transportation (Non-Medical): No  Physical Activity: Not on file  Stress: Not on file  Social Connections: Unknown (10/29/2021)   Received from [facility], [facility]   Social Network    Social Network: Not on file   Outpatient Encounter Medications as of 08/28/2023  Medication Sig   etanercept (ENBREL) 50  MG/ML injection Inject 50 mg into the skin once a week.   predniSONE (STERAPRED UNI-PAK 48 TAB) 5 MG (48) TBPK tablet SMARTSIG:1 By Mouth As Directed   busPIRone (BUSPAR) 10 MG tablet Take 0.5-1 tablets (5-10 mg total) by mouth 3 (three) times daily. For anxiety   citalopram (CELEXA) 40 MG tablet Take 1 tablet (40 mg total) by mouth daily.   Cyanocobalamin (VITAMIN B 12) 500 MCG TABS Take 500 mcg by mouth daily with supper.   leflunomide (ARAVA) 20 MG tablet Take 20 mg by mouth daily.   levothyroxine (SYNTHROID) 25 MCG tablet Take 1.5 tablets (37.5 mcg total) by mouth daily before breakfast.   triamcinolone (KENALOG) 0.025 % cream Apply 1 Application topically 2 (two) times daily. X5 days as needed for chelitis   [DISCONTINUED] Upadacitinib ER (RINVOQ) 15 MG TB24 Take 1 tablet by mouth daily in the afternoon.   No facility-administered encounter medications on file as of 08/28/2023.   ALLERGIES: Allergies  Allergen Reactions   Sulfa Antibiotics Hives    VACCINATION STATUS: Immunization History  Administered Date(s) Administered   Influenza Inj Mdck Quad Pf 06/04/2018   Influenza,inj,Quad PF,6+ Mos 06/04/2017, 04/19/2019, 03/26/2022   Influenza-Unspecified 06/04/2018   PFIZER Comirnaty(Gray Top)Covid-19 Tri-Sucrose  Vaccine 08/02/2020   PPD Test 03/22/2019, 03/11/2022, 02/19/2023    HPI Lori Koch is 48 y.o. female who presents today with a medical history as above. she is being seen in follow-up for hypothyroidism, multinodular goiter.  -She is currently on levothyroxine 37.5 mcg p.o. daily before breakfast.   She continues to feel better, reports compliance to her medication.    -She has no new complaints today.  She admits to dietary inconsistencies and lack of regulated sleep time. - She says she was diagnosed with hypothyroidism in 2011.  - She is also known to have nodular goiter, previsit thyroid ultrasound is significant for similar findings compared to prior studies.   Previous  thyroid ultrasound shows overall decrease in size of bilateral thyroid lobes and decrease in size of thyroid nodule in the right lobe to 1.5 cm from 1.7 cm.  This nodule had no suspicious features.  Her previsit thyroid ultrasound documents similar findings. -In the interim, she was diagnosed with rheumatoid arthritis which required brief intervention with steroids which caused unintended weight gain of 5 pounds.      She has diet controlled type 2 diabetes with a reported A1c of 4.5%. -She denies palpitations, hot/cold intolerance. She denies dysphagia, shortness of breath, nor voice change. -She denies exposure to neck radiation. - She is a former smoker. - She has 3 grown kids status post tubal ligation in 1998.  Review of Systems Limited as above.  Objective:    BP 114/68   Pulse 68   Ht 5\' 3"  (1.6 m)   Wt 145 lb (65.8 kg)   BMI 25.69 kg/m   Wt Readings from Last 3 Encounters:  08/28/23 145 lb (65.8 kg)  01/28/23 144 lb 9.6 oz (65.6 kg)  12/23/22 145 lb (65.8 kg)    Physical Exam    Physical Exam- Limited  Constitutional:  Body mass index is 25.69 kg/m. , not in acute distress, normal state of mind   TSH from 03/10/2017 was 1.85 Recent Results (from the past 2160 hours)  Lipid panel     Status: Abnormal   Collection Time: 07/18/23 12:38 PM  Result Value Ref Range   Cholesterol, Total 185 100 - 199 mg/dL   Triglycerides 81 0 - 149 mg/dL   HDL 58 >16 mg/dL   VLDL Cholesterol Cal 15 5 - 40 mg/dL   LDL Chol Calc (NIH) 109 (H) 0 - 99 mg/dL   Chol/HDL Ratio 3.2 0.0 - 4.4 ratio    Comment:                                   T. Chol/HDL Ratio                                             Men  Women                               1/2 Avg.Risk  3.4    3.3                                   Avg.Risk  5.0    4.4  2X Avg.Risk  9.6    7.1                                3X Avg.Risk 23.4   11.0   TSH     Status: None   Collection Time:  07/18/23 12:38 PM  Result Value Ref Range   TSH 1.380 0.450 - 4.500 uIU/mL  T4, free     Status: None   Collection Time: 07/18/23 12:38 PM  Result Value Ref Range   Free T4 1.27 0.82 - 1.77 ng/dL  Vitamin B28     Status: None   Collection Time: 07/18/23 12:38 PM  Result Value Ref Range   Vitamin B-12 712 232 - 1,245 pg/mL   Thyroid ultrasound from 04/25/2017 showed right lobe 4.6 cm with 1.5 cm nodule; left lobe 3.3 cm with no nodules.   Thyroid ultrasound on May 04, 2018: Right lobe decrease in size to 3.6 cm from four-point recliner, left lobe decreased to 3.1 cm from 3.3 cm.  1.5 cm nodule on the right lobe, previously 1.7 cm.  Thyroid ultrasound on May 17, 2019 -Confirm 2-year stability of 1.6 cm category 3 nodule in the right medial gland.  Recommend continued follow-up every 1-2 years until 5-year stability has been confirmed.   Thyroid ultrasound on Nov 10, 2020:  IMPRESSION: 1. Nodule 1 located in the mid right thyroid lobe is not significantly changed in size since 04/25/2017. Follow-up ultrasound should be performed to document 5 years of stability. 2. Nodule 3 located in the left inferior thyroid lobe is not significantly changed in size since 05/17/2019. This meets criteria for surveillance. Follow-up thyroid ultrasound should be performed in 1 year.  Assessment & Plan:   1. Hypothyroidism 2. Thyroid nodule 3.  Vitamin B12 deficiency -Her previsit thyroid function tests are consistent with appropriate replacement.  She is advised to continue levothyroxine 37.5 mcg p.o. daily before breakfast.    - We discussed about the correct intake of her thyroid hormone, on empty stomach at fasting, with water, separated by at least 30 minutes from breakfast and other medications,  and separated by more than 4 hours from calcium, iron, multivitamins, acid reflux medications (PPIs). -Patient is made aware of the fact that thyroid hormone replacement is needed for  life, dose to be adjusted by periodic monitoring of thyroid function tests.   - Regarding 1.5 cm nodule on the right lobe and 0.9 cm nodule in the left lobe with no suspicious features, she will not need any intervention at this time.  Her previsit thyroid ultrasound is unremarkable-see above.    Her previsit labs show normal cortisol, favorable CMP, improved lipid panel, and vitamin B12 deficiency.  She is advised to start vitamin B12 500 mcg p.o. daily from over-the-counter.     - I advised patient to maintain close follow up with Raliegh Ip, DO for primary care needs.   I spent  22  minutes in the care of the patient today including review of labs from Thyroid Function, CMP, and other relevant labs ; imaging/biopsy records (current and previous including abstractions from other facilities); face-to-face time discussing  her lab results and symptoms, medications doses, her options of short and long term treatment based on the latest standards of care / guidelines;   and documenting the encounter.  Lori Koch  participated in the discussions, expressed understanding, and voiced agreement with the above plans.  All questions were answered to her satisfaction. she is encouraged to contact clinic should she have any questions or concerns prior to her return visit.   Follow up plan: Return in about 6 months (around 02/28/2024) for Fasting Labs  in AM B4 8.  Marquis Lunch, MD [facility] Phone: [phone]  Fax: [phone]   08/28/2023, 10:24 AM This note was partially dictated with voice recognition software. Similar sounding words can be transcribed inadequately or may not  be corrected upon review.

## 2023-10-13 ENCOUNTER — Telehealth: Payer: Self-pay | Admitting: Physician Assistant

## 2023-10-13 DIAGNOSIS — R3989 Other symptoms and signs involving the genitourinary system: Secondary | ICD-10-CM

## 2023-10-13 MED ORDER — CEPHALEXIN 500 MG PO CAPS: 500.0000 mg | ORAL_CAPSULE | Freq: Two times a day (BID) | ORAL | 0 refills | Status: DC

## 2023-10-13 NOTE — Progress Notes (Signed)

## 2023-10-29 ENCOUNTER — Encounter: Payer: PRIVATE HEALTH INSURANCE | Admitting: Family Medicine

## 2023-11-10 ENCOUNTER — Telehealth: Payer: Self-pay | Admitting: Family Medicine

## 2023-11-10 DIAGNOSIS — R399 Unspecified symptoms and signs involving the genitourinary system: Secondary | ICD-10-CM

## 2023-11-10 MED ORDER — CEPHALEXIN 500 MG PO CAPS: 500.0000 mg | ORAL_CAPSULE | Freq: Two times a day (BID) | ORAL | 0 refills | Status: AC

## 2023-11-10 NOTE — Progress Notes (Signed)

## 2023-11-14 ENCOUNTER — Encounter: Payer: Self-pay | Admitting: Family Medicine

## 2023-11-14 ENCOUNTER — Ambulatory Visit: Payer: Self-pay | Admitting: Family Medicine

## 2023-11-14 VITALS — BP 111/73 | HR 80 | Temp 98.4°F | Ht 63.0 in | Wt 142.0 lb

## 2023-11-14 DIAGNOSIS — F410 Panic disorder [episodic paroxysmal anxiety] without agoraphobia: Secondary | ICD-10-CM

## 2023-11-14 DIAGNOSIS — Z0001 Encounter for general adult medical examination with abnormal findings: Secondary | ICD-10-CM

## 2023-11-14 DIAGNOSIS — F411 Generalized anxiety disorder: Secondary | ICD-10-CM

## 2023-11-14 DIAGNOSIS — E782 Mixed hyperlipidemia: Secondary | ICD-10-CM

## 2023-11-14 DIAGNOSIS — Z8249 Family history of ischemic heart disease and other diseases of the circulatory system: Secondary | ICD-10-CM

## 2023-11-14 DIAGNOSIS — Z1211 Encounter for screening for malignant neoplasm of colon: Secondary | ICD-10-CM

## 2023-11-14 DIAGNOSIS — Z0000 Encounter for general adult medical examination without abnormal findings: Secondary | ICD-10-CM

## 2023-11-14 MED ORDER — CITALOPRAM HYDROBROMIDE 40 MG PO TABS: 40.0000 mg | ORAL_TABLET | Freq: Every day | ORAL | 3 refills | Status: AC

## 2023-11-14 MED ORDER — BUSPIRONE HCL 10 MG PO TABS
5.0000 mg | ORAL_TABLET | Freq: Three times a day (TID) | ORAL | 3 refills | Status: AC
Start: 2023-11-14 — End: ?

## 2023-11-14 NOTE — Patient Instructions (Signed)
Coronary Calcium Scan A coronary calcium scan is an imaging test used to look for deposits of plaque in the inner lining of the blood vessels of the heart (coronary arteries). Plaque is made up of calcium, protein, and fatty substances. These deposits of plaque can partly clog and narrow the coronary arteries without producing any symptoms or warning signs. This puts a person at risk for a heart attack. A coronary calcium scan is performed using a computed tomography (CT) scanner machine without using a dye (contrast). This test is recommended for people who are at moderate risk for heart disease. The test can find plaque deposits before symptoms develop. Tell a health care provider about: Any allergies you have. All medicines you are taking, including vitamins, herbs, eye drops, creams, and over-the-counter medicines. Any problems you or family members have had with anesthetic medicines. Any bleeding problems you have. Any surgeries you have had. Any medical conditions you have. Whether you are pregnant or may be pregnant. What are the risks? Generally, this is a safe procedure. However, problems may occur, including: Harm to a pregnant woman and her unborn baby. This test involves the use of radiation. Radiation exposure can be dangerous to a pregnant woman and her unborn baby. If you are pregnant or think you may be pregnant, you should not have this procedure done. A slight increase in the risk of cancer. This is because of the radiation involved in the test. The amount of radiation from one test is similar to the amount of radiation you are naturally exposed to over one year. What happens before the procedure? Ask your health care provider for any specific instructions on how to prepare for this procedure. You may be asked to avoid products that contain caffeine, tobacco, or nicotine for 4 hours before the procedure. What happens during the procedure?  You will undress and remove any jewelry  from your neck or chest. You may need to remove hearing aides and dentures. Women may need to remove their bras. You will put on a hospital gown. Sticky electrodes will be placed on your chest. The electrodes will be connected to an electrocardiogram (ECG) machine to record a tracing of the electrical activity of your heart. You will lie down on your back on a curved bed that is attached to the CT scanner. You may be given medicine to slow down your heart rate so that clear pictures can be created. You will be moved into the CT scanner, and the CT scanner will take pictures of your heart. During this time, you will be asked to lie still and hold your breath for 10-20 seconds at a time while each picture of your heart is being taken. The procedure may vary among health care providers and hospitals. What can I expect after the procedure? You can return to your normal activities. It is up to you to get the results of your procedure. Ask your health care provider, or the department that is doing the procedure, when your results will be ready. Summary A coronary calcium scan is an imaging test used to look for deposits of plaque in the inner lining of the blood vessels of the heart. Plaque is made up of calcium, protein, and fatty substances. A coronary calcium scan is performed using a CT scanner machine without contrast. Generally, this is a safe procedure. Tell your health care provider if you are pregnant or may be pregnant. Ask your health care provider for any specific instructions on how to  prepare for this procedure. You can return to your normal activities after the scan is done. This information is not intended to replace advice given to you by your health care provider. Make sure you discuss any questions you have with your health care provider. Document Revised: 05/13/2021 Document Reviewed: 05/13/2021 Elsevier Patient Education  2024 [employer].

## 2023-11-14 NOTE — Progress Notes (Signed)
 Lori Koch is a 48 y.o. female presents to office today for annual physical exam examination.    Concerns today include: 1.  None.  She reports that she is doing well.  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 Refills needed today: Celexa  and BuSpar .  She was placed on Cymbalta by her rheumatologist but notes that she will not be continuing this because it is causing severe GI issues and causing dizziness.  She was not instructed to taper from Celexa  whilst on the SNRI  Immunization History  Administered Date(s) Administered   Influenza Inj Mdck Quad Pf 06/04/2018   Influenza,inj,Quad PF,6+ Mos 06/04/2017, 04/19/2019, 03/26/2022   Influenza-Unspecified 06/04/2018   PFIZER Comirnaty(Gray Top)Covid-19 Tri-Sucrose Vaccine 08/02/2020   PPD Test 03/22/2019, 03/11/2022, 02/19/2023   Past Medical History:  Diagnosis Date   Diabetes mellitus without complication (HCC) 2019   diet only   Thyroid  disease    hypothyroidism   Social History   Socioeconomic History   Marital status: Married    Spouse name: Not on file   Number of children: 3   Years of education: Not on file   Highest education level: GED or equivalent  Occupational History   Not on file  Tobacco Use   Smoking status: Former    Current packs/day: 0.00    Average packs/day: 0.5 packs/day for 10.0 years (5.0 ttl pk-yrs)    Types: Cigarettes    Start date: 42    Quit date: 1995    Years since quitting: 30.4   Smokeless tobacco: Never  Vaping Use   Vaping status: Never Used  Substance and Sexual Activity   Alcohol use: No   Drug use: No   Sexual activity: Yes    Birth control/protection: None  Other Topics Concern   Not on file  Social History Narrative   Not on file   Social Drivers of Health   Financial Resource  Strain: Not on file  Food Insecurity: No Food Insecurity (07/26/2021)   Hunger Vital Sign    Worried About Running Out of Food in the Last Year: Never true    Ran Out of Food in the Last Year: Never true  Transportation Needs: No Transportation Needs (07/26/2021)   PRAPARE - Administrator, Civil Service (Medical): No    Lack of Transportation (Non-Medical): No  Physical Activity: Not on file  Stress: Not on file  Social Connections: Unknown (10/29/2021)   Received from [facility], [facility]   Social Network    Social Network: Not on file  Intimate Partner Violence: Unknown (09/20/2021)   Received from [facility], [facility]   HITS    Physically Hurt: Not on file    Insult or Talk Down To: Not on file    Threaten Physical Harm: Not on file    Scream or Curse: Not on file   Past Surgical History:  Procedure Laterality Date   BREAST BIOPSY Left 07/30/2021   FIBROADENOMATOID NODULE   TUBAL LIGATION  1998   Family History  Problem Relation Age of Onset   Breast cancer Mother    Cancer Mother 50       breast   Hypothyroidism Mother    Heart murmur Mother    Coronary artery disease Mother    Heart murmur Daughter  Bipolar disorder Daughter    Autism Daughter    Allergic Disorder Daughter    Asthma Daughter    Allergic Disorder Daughter    Breast cancer Maternal Grandmother    Dementia Maternal Grandmother    Atrial fibrillation Maternal Grandmother    Thyroid  disease Maternal Grandmother    Heart disease Maternal Grandfather    Heart attack Maternal Grandfather     Current Outpatient Medications:    busPIRone  (BUSPAR ) 10 MG tablet, Take 0.5-1 tablets (5-10 mg total) by mouth 3 (three) times daily. For anxiety, Disp: 270 tablet, Rfl: 3   cephALEXin  (KEFLEX ) 500 MG capsule, Take 1 capsule (500 mg total) by mouth 2 (two) times daily for 7 days., Disp: 14 capsule, Rfl: 0   citalopram  (CELEXA ) 40 MG tablet, Take 1 tablet (40 mg total) by mouth daily.,  Disp: 90 tablet, Rfl: 3   Cyanocobalamin (VITAMIN B 12) 500 MCG TABS, Take 500 mcg by mouth daily with supper., Disp: , Rfl:    DULoxetine (CYMBALTA) 30 MG capsule, Take 30 mg by mouth at bedtime., Disp: , Rfl:    etanercept (ENBREL) 50 MG/ML injection, Inject 50 mg into the skin once a week., Disp: , Rfl:    leflunomide (ARAVA) 20 MG tablet, Take 20 mg by mouth daily., Disp: , Rfl:    levothyroxine  (SYNTHROID ) 25 MCG tablet, Take 1.5 tablets (37.5 mcg total) by mouth daily before breakfast., Disp: 45 tablet, Rfl: 0   predniSONE  (STERAPRED UNI-PAK 48 TAB) 5 MG (48) TBPK tablet, SMARTSIG:1 By Mouth As Directed, Disp: , Rfl:    triamcinolone  (KENALOG ) 0.025 % cream, Apply 1 Application topically 2 (two) times daily. X5 days as needed for chelitis, Disp: 30 g, Rfl: 0   etanercept (ENBREL SURECLICK) 50 MG/ML injection, as directed Subcutaneous weekly for 90 days, Disp: , Rfl:   Allergies  Allergen Reactions   Sulfa Antibiotics Hives     ROS: Review of Systems Pertinent items noted in HPI and remainder of comprehensive ROS otherwise negative.    Physical exam BP 111/73   Pulse 80   Temp 98.4 F (36.9 C)   Ht 5\' 3"  (1.6 m)   Wt 142 lb (64.4 kg)   SpO2 98%   BMI 25.15 kg/m  General appearance: alert, cooperative, appears stated age, and no distress Head: Normocephalic, without obvious abnormality, atraumatic Eyes: negative findings: lids and lashes normal, conjunctivae and sclerae normal, corneas clear, and pupils equal, round, reactive to light and accomodation Ears: normal TM's and external ear canals both ears Nose: Nares normal. Septum midline. Mucosa normal. No drainage or sinus tenderness. Throat: lips, mucosa, and tongue normal; teeth and gums normal Neck: no adenopathy, supple, symmetrical, trachea midline, and thyroid  not enlarged, symmetric, no tenderness/mass/nodules Back: symmetric, no curvature. ROM normal. No CVA tenderness. Lungs: clear to auscultation bilaterally Heart:  regular rate and rhythm, S1, S2 normal, no murmur, click, rub or gallop Abdomen: soft, non-tender; bowel sounds normal; no masses,  no organomegaly Extremities: extremities normal, atraumatic, no cyanosis or edema Pulses: 2+ and symmetric Skin: Skin color, texture, turgor normal. No rashes or lesions Lymph nodes: Cervical, supraclavicular, and axillary nodes normal. Neurologic: Grossly normal      11/14/2023    3:00 PM 11/13/2022    8:39 AM 10/28/2022    8:31 AM  Depression screen PHQ 2/9  Decreased Interest 0 1 2  Down, Depressed, Hopeless 0 1 2  PHQ - 2 Score 0 2 4  Altered sleeping 0 2 3  Tired, decreased  energy 0 2 2  Change in appetite 0 2 2  Feeling bad or failure about yourself  0 0 2  Trouble concentrating 0 1 2  Moving slowly or fidgety/restless 0 0 0  Suicidal thoughts 0 0 0  PHQ-9 Score 0 9 15  Difficult doing work/chores Not difficult at all Not difficult at all Somewhat difficult      11/14/2023    3:00 PM 10/28/2022    8:30 AM 02/28/2021    2:05 PM 01/04/2021    3:02 PM  GAD 7 : Generalized Anxiety Score  Nervous, Anxious, on Edge 0 2 2 2   Control/stop worrying 0 2 1 3   Worry too much - different things 0 3 1 2   Trouble relaxing 0 2 3 3   Restless 0 2 2 0  Easily annoyed or irritable 0 2 3 2   Afraid - awful might happen 0 0 0 0  Total GAD 7 Score 0 13 12 12   Anxiety Difficulty Not difficult at all Somewhat difficult Somewhat difficult Somewhat difficult     Assessment/ Plan: Lori Koch here for annual physical exam.   Annual physical exam  Family history of heart disease - Plan: CT CARDIAC SCORING (SELF PAY ONLY)  Mixed hyperlipidemia - Plan: CT CARDIAC SCORING (SELF PAY ONLY)  GAD (generalized anxiety disorder) - Plan: busPIRone  (BUSPAR ) 10 MG tablet, citalopram  (CELEXA ) 40 MG tablet  Screening for colon cancer  Panic attack - Plan: busPIRone  (BUSPAR ) 10 MG tablet, citalopram  (CELEXA ) 40 MG tablet  She declines tetanus and colon cancer  screening at this time as she is uninsured.  Willing to proceed with coronary artery calcium score given family history of heart disease and ongoing hyperlipidemia that is not treated with statin therapy.  Sounds like she is doing great from a mental health standpoint but I agree that utilization of SNRI plus high dose SSRI is probably not in her best interest for both the GI standpoint and risk of serotonin syndrome.  Since the medication is not even helping with her fibromyalgia I think it is worth discontinuation.  I did offer reduction in the Celexa  so that she could use the Cymbalta but she declined this   Counseled on healthy lifestyle choices, including diet (rich in fruits, vegetables and lean meats and low in salt and simple carbohydrates) and exercise (at least 30 minutes of moderate physical activity daily).  Patient to follow up 1 year  Haiden Clucas M. Bonnell Butcher, DO

## 2023-12-01 ENCOUNTER — Ambulatory Visit (HOSPITAL_COMMUNITY)
Admission: RE | Admit: 2023-12-01 | Discharge: 2023-12-01 | Disposition: A | Discharge status: Home / Self Care | Payer: Self-pay | Source: Ambulatory Visit | Attending: Family Medicine | Admitting: Family Medicine

## 2023-12-01 ENCOUNTER — Ambulatory Visit: Payer: Self-pay | Admitting: Family Medicine

## 2023-12-01 DIAGNOSIS — Z8249 Family history of ischemic heart disease and other diseases of the circulatory system: Secondary | ICD-10-CM

## 2023-12-01 DIAGNOSIS — R931 Abnormal findings on diagnostic imaging of heart and coronary circulation: Secondary | ICD-10-CM

## 2023-12-01 DIAGNOSIS — E782 Mixed hyperlipidemia: Secondary | ICD-10-CM | POA: Insufficient documentation

## 2023-12-01 MED ORDER — ROSUVASTATIN CALCIUM 10 MG PO TABS
10.0000 mg | ORAL_TABLET | Freq: Every day | ORAL | 3 refills | Status: AC
Start: 2023-12-01 — End: ?

## 2023-12-02 ENCOUNTER — Telehealth: Payer: Self-pay

## 2023-12-02 DIAGNOSIS — R931 Abnormal findings on diagnostic imaging of heart and coronary circulation: Secondary | ICD-10-CM

## 2023-12-02 DIAGNOSIS — Z8249 Family history of ischemic heart disease and other diseases of the circulatory system: Secondary | ICD-10-CM

## 2023-12-02 DIAGNOSIS — E782 Mixed hyperlipidemia: Secondary | ICD-10-CM

## 2023-12-02 NOTE — Telephone Encounter (Signed)
Pt notified.    LS

## 2023-12-02 NOTE — Telephone Encounter (Signed)
 done

## 2023-12-02 NOTE — Telephone Encounter (Signed)
 Copied from CRM [id number]. Topic: Referral - Request for Referral >> Dec 02, 2023  8:22 AM Gates Kasal P wrote: Did the patient discuss referral with their provider in the last year? Yes (If No - schedule appointment) (If Yes - send message)  Appointment offered? Yes  Type of order/referral and detailed reason for visit: cardiologist  Preference of office, provider, location: PT would like to choose [facility] location. Any provider at that location  Address: inside [facility], [address] [place], [place], [place] [address] Phone: [phone]  If referral order, have you been seen by this specialty before? No (If Yes, this issue or another issue? When? Where?  Can we respond through MyChart? Yes

## 2023-12-02 NOTE — Addendum Note (Signed)
 Addended by: Eliodoro Guerin on: 12/02/2023 09:38 AM   Modules accepted: Orders

## 2023-12-03 ENCOUNTER — Encounter: Payer: Self-pay | Admitting: Family Medicine

## 2024-01-01 IMAGING — MG MM BREAST BX W LOC DEV 1ST LESION IMAGE BX SPEC STEREO GUIDE*L*
6 of 12 series · 6 of 36 positions shown · non-contrast
Comparison: Previous exams.
COMPARISON: Previous exams.

Addendum:
CLINICAL DATA: Patient presents for stereotactic guided core biopsy
of LEFT breast mass.

EXAM:
LEFT BREAST STEREOTACTIC CORE NEEDLE BIOPSY

[L (1 of 6)]
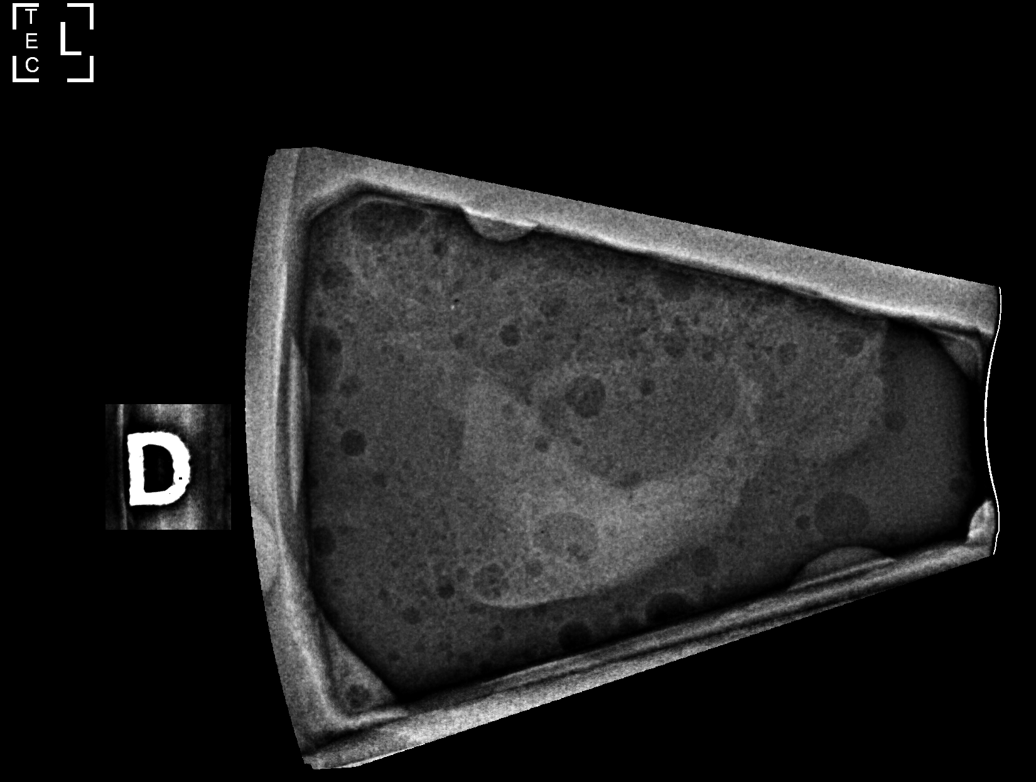

[L (2 of 6)]
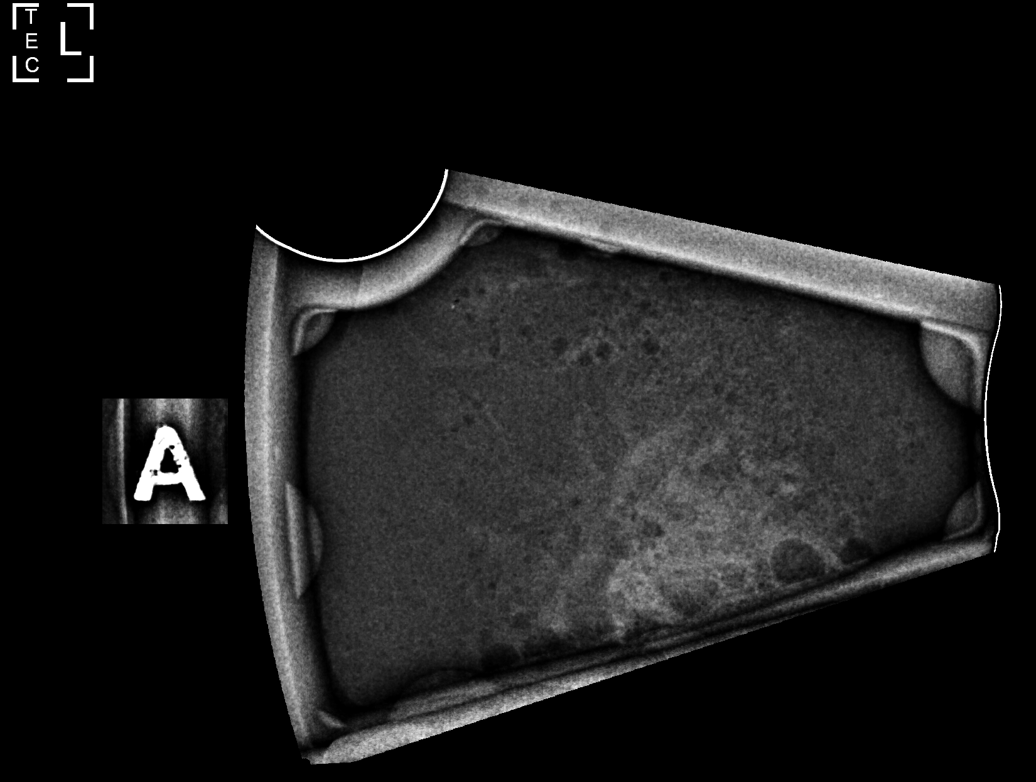

[L (3 of 6)]
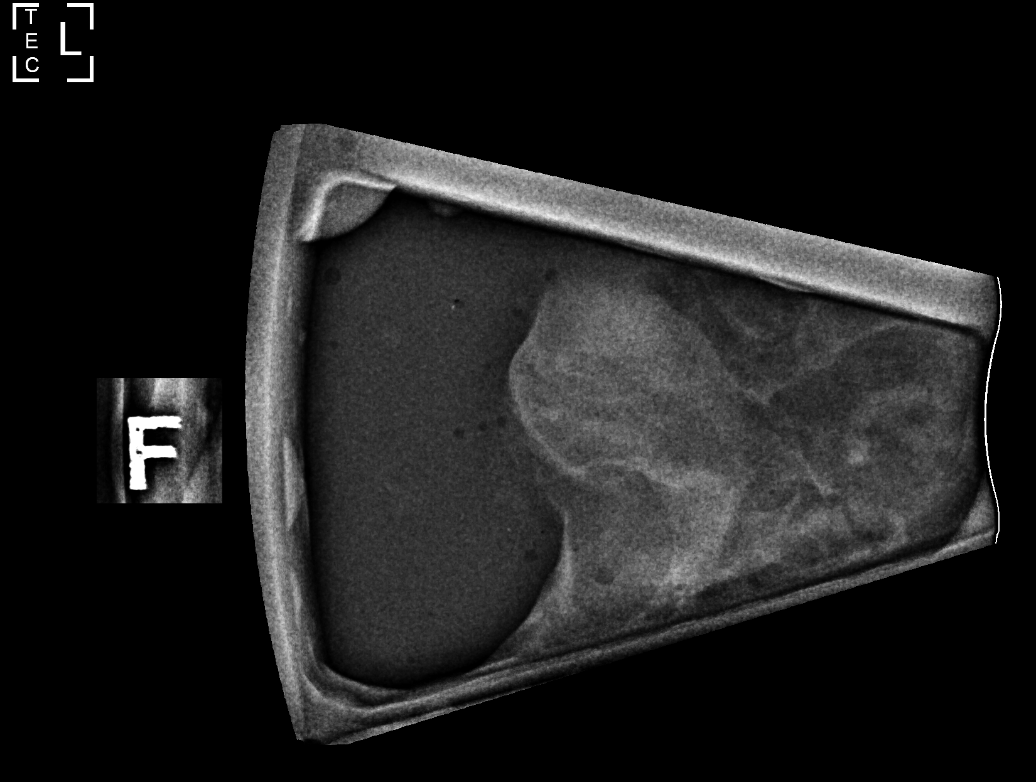

[L (4 of 6)]
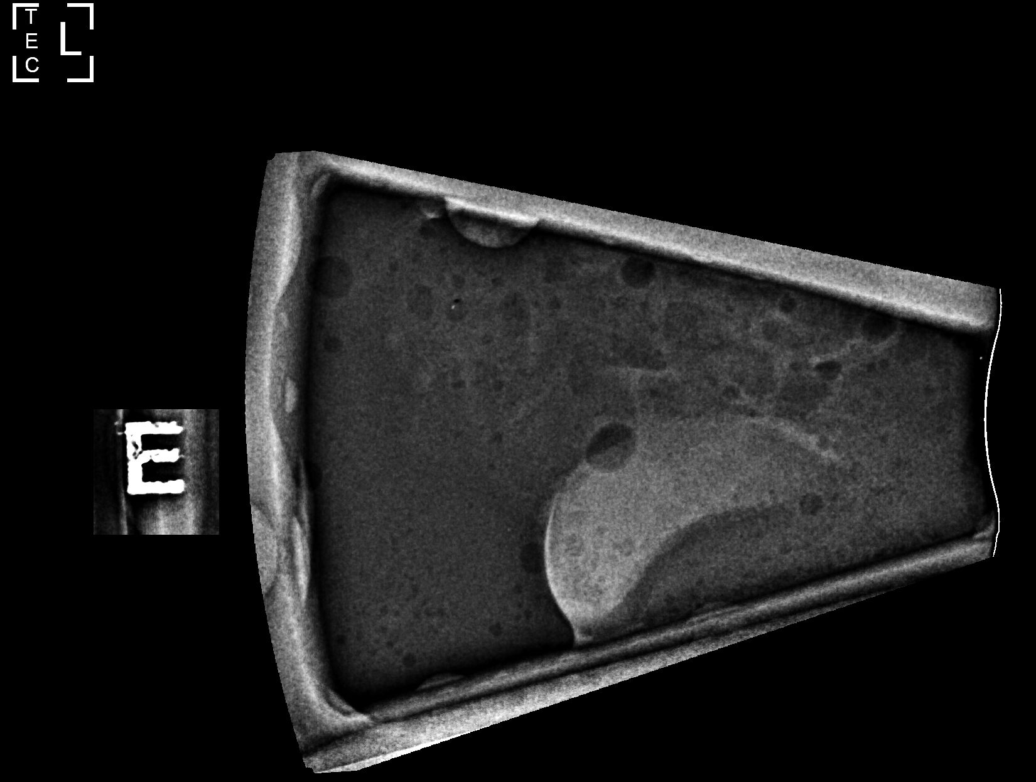

[L (5 of 6)]
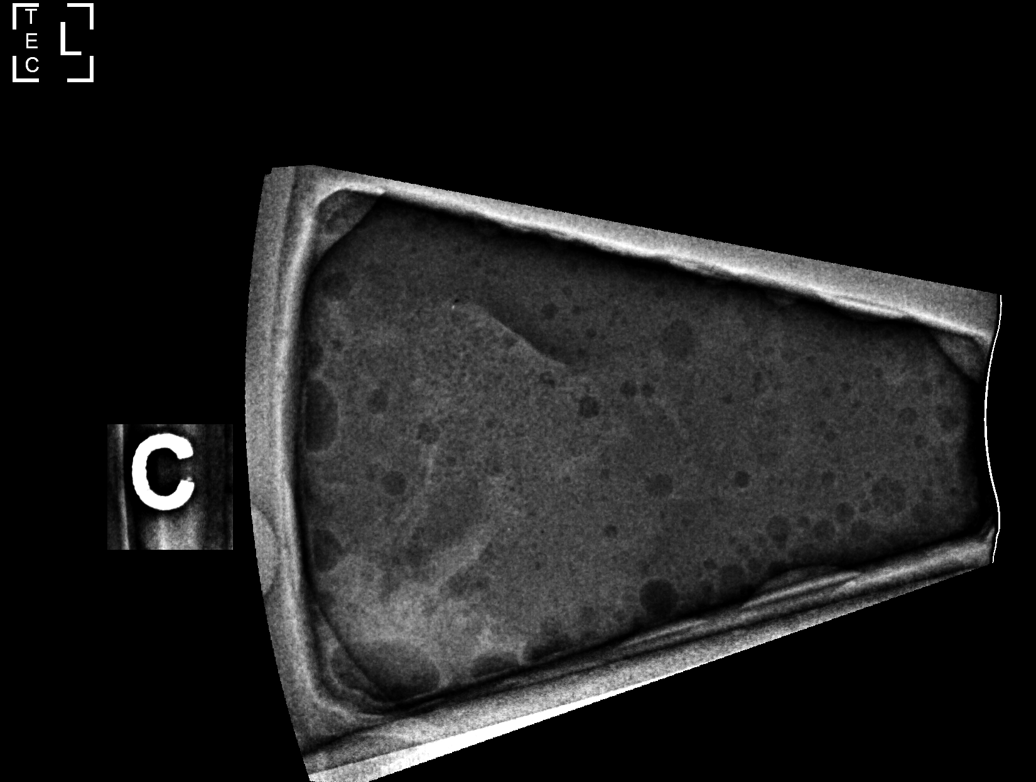

[L (6 of 6)]
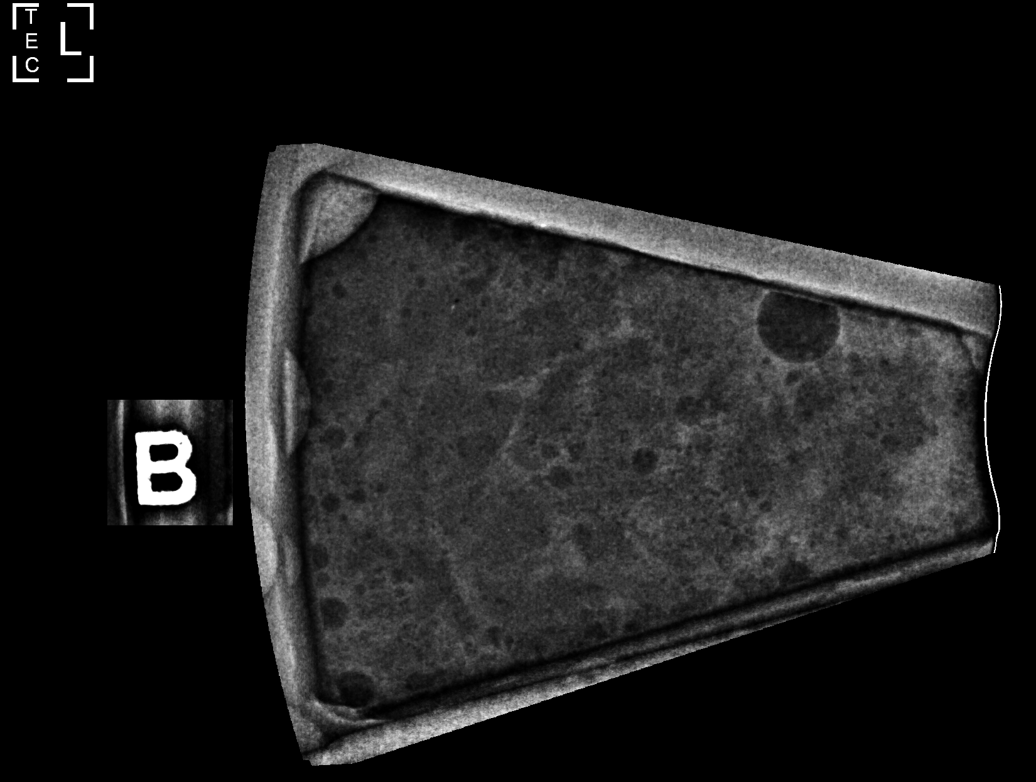

[6 of 36 positions shown; findings below may reference images not displayed]



Using sterile technique and 1% Lidocaine as local anesthetic, under
stereotactic guidance, a 9 gauge vacuum assisted device was used to
perform core needle biopsy of mass in the UPPER INNER QUADRANT LEFT
breast using a craniocaudal approach.

Lesion quadrant: UPPER INNER QUADRANT

At the conclusion of the procedure, coil shaped tissue marker clip
was deployed into the biopsy cavity. Follow-up 2-view mammogram was
performed and dictated separately.

Small hematoma is identified on post clip images. A pressure
dressing was applied.
IMPRESSION: Stereotactic-guided biopsy of LEFT breast. Small hematoma at the
biopsy site.

ADDENDUM:
Pathology revealed FIBROADENOMATOID NODULE of the LEFT breast, upper
inner quadrant, (coil clip). This was found to be concordant by Dr.
Yukari Albarado.

Pathology results were discussed with the patient by telephone. The
patient reported doing well after the biopsy with tenderness and
itching at the site. Post biopsy instructions and care were reviewed
and questions were answered. The patient was encouraged to call The

The patient was instructed to return for annual screening
mammography in February 2022.

Pathology results reported by Eunei Aby, RN on 07/30/2021.



Using sterile technique and 1% Lidocaine as local anesthetic, under
stereotactic guidance, a 9 gauge vacuum assisted device was used to
perform core needle biopsy of mass in the UPPER INNER QUADRANT LEFT
breast using a craniocaudal approach.

Lesion quadrant: UPPER INNER QUADRANT

At the conclusion of the procedure, coil shaped tissue marker clip
was deployed into the biopsy cavity. Follow-up 2-view mammogram was
performed and dictated separately.

Small hematoma is identified on post clip images. A pressure
dressing was applied.
IMPRESSION: Stereotactic-guided biopsy of LEFT breast. Small hematoma at the
biopsy site.

## 2024-01-09 ENCOUNTER — Other Ambulatory Visit: Payer: Self-pay

## 2024-01-09 DIAGNOSIS — E785 Hyperlipidemia, unspecified: Secondary | ICD-10-CM | POA: Diagnosis not present

## 2024-01-10 LAB — TSH: TSH: 1.93 u[IU]/mL (ref 0.450–4.500)

## 2024-01-10 LAB — T4, FREE: Free T4: 1.26 ng/dL (ref 0.82–1.77)

## 2024-01-10 LAB — LIPID PANEL
Chol/HDL Ratio: 2.3 ratio (ref 0.0–4.4)
Cholesterol, Total: 123 mg/dL (ref 100–199)
HDL: 54 mg/dL (ref >39)
LDL Chol Calc (NIH): 54 mg/dL (ref 0–99)
Triglycerides: 73 mg/dL (ref 0–149)
VLDL Cholesterol Cal: 15 mg/dL (ref 5–40)

## 2024-01-12 ENCOUNTER — Ambulatory Visit: Payer: Self-pay | Admitting: Nurse Practitioner

## 2024-01-12 ENCOUNTER — Encounter: Payer: Self-pay | Admitting: Nurse Practitioner

## 2024-01-12 VITALS — BP 120/73 | HR 60 | Temp 97.9°F | Ht 63.0 in | Wt 145.6 lb

## 2024-01-12 DIAGNOSIS — M5441 Lumbago with sciatica, right side: Secondary | ICD-10-CM

## 2024-01-12 DIAGNOSIS — M722 Plantar fascial fibromatosis: Secondary | ICD-10-CM

## 2024-01-12 MED ORDER — TIZANIDINE HCL 4 MG PO TABS: 4.0000 mg | ORAL_TABLET | Freq: Four times a day (QID) | ORAL | 0 refills | Status: DC | PRN

## 2024-01-12 MED ORDER — PREDNISONE 10 MG PO TABS: 10.0000 mg | ORAL_TABLET | Freq: Every day | ORAL | 0 refills | Status: DC

## 2024-01-12 NOTE — Progress Notes (Signed)
 Acute Office Visit  Subjective:     Patient ID: Lori Koch, female    DOB: 05-20-1976, 48 y.o.   MRN: [id number]  Chief Complaint  Patient presents with   Back Pain    Lower back goes down right hip and leg with a knot on the bottom of right foot     HPI Lori Koch is a 48 year old female present December 13, 2023 concern for low back pain in right foot pain.  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  Has history of arthritis  Low Back Pain: Patient complains of chronic low back pain. This is evaluated as a personal injury. The patient first noted symptoms 2months ago. It was related to no known injury. The pain is rated 7 /10, and is located at the right gluteal area or radiating to Lori Koch is a 47 year old female who presents on December 13, 2023, with concerns of chronic low back pain and right foot pain. 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 She has a known history of arthritis.  Low Back Pain: The patient reports chronic low back pain that began approximately 2 months ago, with no known injury. Pain is localized to the right gluteal area and radiates down the right leg. She describes the pain as burning and sharp, rated 7/10 in intensity, and present throughout the day. The pain has been progressively worsening. Aggravating factors include prolonged standing, standing for more than a few minutes, and twisting. She reports no relief from any position or activity. No associated symptoms such as numbness, tingling, or bowel/bladder issues are noted. She has used prescription NSAIDs in the past, with minimal or inconsistent relief.  Right Foot Pain: The patient also reports chronic right foot pain that began several years ago. There was no known injury at onset, though she experienced immediate pain when the symptoms first appeared. Pain has remained chronic since then. She denies any prior issues  with the right foot before this episode. leg(s). The pain is described as burning and sharp and occurs all day. The symptoms admits to been progressive. Symptoms are exacerbated by standing, standing for more than a few minutes, and twisting. Factors which relieve the pain include nothing. Other associated symptoms include no other symptoms. Previous history of symptoms: the problem is long-standing.  Treatment efforts have included prescription NSAIDS, with and without relief.  Right foot pain:   right foot painyear(s) ago. Mechanism of injury: none. Immediate symptoms: immediate pain. Symptoms have been chronic since that time. Prior history of related problems: no prior problems with this area in the past.   Active Ambulatory Problems    Diagnosis Date Noted   Hypothyroidism 03/10/2017   Thyroid  nodule 03/10/2017   History of herniated intervertebral disc 03/10/2017   Multinodular goiter 06/26/2021   Fatigue 07/09/2022   Family history of heart disease 10/28/2022   GAD (generalized anxiety disorder) 10/28/2022   Panic attack 10/28/2022   Vitamin B deficiency 01/28/2023   Acute midline low back pain with right-sided sciatica 01/12/2024   Plantar fasciitis of right foot 01/12/2024   Resolved Ambulatory Problems    Diagnosis Date Noted   No Resolved Ambulatory Problems   Past Medical History:  Diagnosis Date   Diabetes mellitus without complication (HCC) 2019   Thyroid  disease     Review of Systems  Constitutional:  Negative for chills and fever.  HENT:  Negative for congestion and sore throat.   Respiratory:  Negative for cough, shortness of breath and wheezing.   Cardiovascular:  Negative for chest pain and leg swelling.  Gastrointestinal:  Negative for blood in stool, melena, nausea and vomiting.  Musculoskeletal:  Positive for back pain.       Right foot pain  Skin:  Negative for itching and rash.  Neurological:  Negative for dizziness and headaches.   Negative unless  indicated in HPI    Objective:    BP 120/73   Pulse 60   Temp 97.9 F (36.6 C) (Temporal)   Ht 5' 3 (1.6 m)   Wt 145 lb 9.6 oz (66 kg)   BMI 25.79 kg/m  BP Readings from Last 3 Encounters:  01/12/24 120/73  11/14/23 111/73  08/28/23 114/68   Wt Readings from Last 3 Encounters:  01/12/24 145 lb 9.6 oz (66 kg)  11/14/23 142 lb (64.4 kg)  08/28/23 145 lb (65.8 kg)      Physical Exam Vitals and nursing note reviewed.  Constitutional:      Appearance: She is normal weight. She is not ill-appearing.  HENT:     Head: Normocephalic and atraumatic.     Nose: Nose normal.     Mouth/Throat:     Mouth: Mucous membranes are moist.  Eyes:     General: No scleral icterus.    Extraocular Movements: Extraocular movements intact.     Conjunctiva/sclera: Conjunctivae normal.     Pupils: Pupils are equal, round, and reactive to light.  Cardiovascular:     Heart sounds: Normal heart sounds.  Pulmonary:     Effort: Pulmonary effort is normal.     Breath sounds: Normal breath sounds.  Musculoskeletal:     Lumbar back: Tenderness present. Positive left straight leg raise test.     Right foot: Swelling and tenderness present. No bony tenderness.     Left foot: Normal.  Skin:    General: Skin is warm and dry.     Capillary Refill: Capillary refill takes less than 2 seconds.     Findings: No rash.  Neurological:     Mental Status: She is oriented to person, place, and time.  Psychiatric:        Mood and Affect: Mood normal.        Behavior: Behavior normal.        Thought Content: Thought content normal.        Judgment: Judgment normal.   X-ray: not available, ordered, but results not yet available. Pertinent labs & imaging results that were available during my care of the patient were reviewed by me and considered in my medical decision making.  No results found for any visits on 01/12/24.      Assessment & Plan:  Acute midline low back pain with right-sided sciatica -      Ambulatory referral to Podiatry -     tiZANidine  HCl; Take 1 tablet (4 mg total) by mouth every 6 (six) hours as needed for muscle spasms.  Dispense: 30 tablet; Refill: 0 -     predniSONE ; Take 1 tablet (10 mg total) by mouth daily with breakfast.  Dispense: 10 tablet; Refill: 0  Plantar fasciitis of right foot -     Ambulatory referral to Podiatry -     predniSONE ; Take 1 tablet (10 mg total) by mouth daily with breakfast.  Dispense: 10 tablet; Refill: 0   Lori Koch is a 48 year old female Caucasian female seen today for back  pain and foot pain  Plantar fasciitis: Referred to podiatry, will feed as tolerated on bottle of frozen/ice water Back pain: Prednisone  10 mg twice daily for 5 days muscle relaxer Zanaflex  4 mg 3 times daily as needed for pain rest the injured area as much as practical, apply heat (warned not to sleep on heating pad), X-Ray ordered, prescription for muscle relaxant See orders for this visit as documented in the electronic medical record.   The above assessment and management plan was discussed with the patient. The patient verbalized understanding of and has agreed to the management plan. Patient is aware to call the clinic if they develop any new symptoms or if symptoms persist or worsen. Patient is aware when to return to the clinic for a follow-up visit. Patient educated on when it is appropriate to go to the emergency department.  Return if symptoms worsen or fail to improve.   Note: This document was prepared by [employer] voice dictation technology and any errors that results from this process are unintentional.

## 2024-01-20 ENCOUNTER — Other Ambulatory Visit: Payer: Self-pay | Admitting: Medical Genetics

## 2024-02-04 ENCOUNTER — Encounter: Payer: Self-pay | Admitting: Cardiology

## 2024-02-04 ENCOUNTER — Ambulatory Visit: Payer: Self-pay | Attending: Cardiology | Admitting: Cardiology

## 2024-02-04 VITALS — BP 124/77 | HR 66 | Ht 63.0 in | Wt 146.0 lb

## 2024-02-04 DIAGNOSIS — R931 Abnormal findings on diagnostic imaging of heart and coronary circulation: Secondary | ICD-10-CM

## 2024-02-04 MED ORDER — ASPIRIN 81 MG PO TBEC: 81.0000 mg | DELAYED_RELEASE_TABLET | Freq: Every day | ORAL | Status: AC

## 2024-02-04 NOTE — Progress Notes (Signed)
 Clinical Summary Lori Koch is a 48 y.o.female seen today as a new consult, referred by Dr Lori Koch for the following medical problems.    1.Coronary atherosclerosis - 11/2023 elevated coronary calcium  score of 47.6, 97% percentile.  - 12/2023 TC 12 HDL 54 TG 73 LDL 54. She is on crestor  10mg  daily.   CAD risk factors: former smoker quit 30 years, smoked about 5 years.  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   Past Medical History:  Diagnosis Date   Diabetes mellitus without complication (HCC) 2019   diet only   Thyroid  disease    hypothyroidism     Allergies  Allergen Reactions   Sulfa Antibiotics Hives     Current Outpatient Medications  Medication Sig Dispense Refill   busPIRone  (BUSPAR ) 10 MG tablet Take 0.5-1 tablets (5-10 mg total) by mouth 3 (three) times daily. For anxiety 270 tablet 3   citalopram  (CELEXA ) 40 MG tablet Take 1 tablet (40 mg total) by mouth daily. 90 tablet 3   Cyanocobalamin (VITAMIN B 12) 500 MCG TABS Take 500 mcg by mouth daily with supper.     etanercept (ENBREL SURECLICK) 50 MG/ML injection as directed Subcutaneous weekly for 90 days     etanercept (ENBREL) 50 MG/ML injection Inject 50 mg into the skin once a week.     leflunomide (ARAVA) 20 MG tablet Take 20 mg by mouth daily.     levothyroxine  (SYNTHROID ) 25 MCG tablet Take 1.5 tablets (37.5 mcg total) by mouth daily before breakfast. 45 tablet 0   predniSONE  (DELTASONE ) 10 MG tablet Take 1 tablet (10 mg total) by mouth daily with breakfast. 10 tablet 0   rosuvastatin  (CRESTOR ) 10 MG tablet Take 1 tablet (10 mg total) by mouth daily. 90 tablet 3   tiZANidine  (ZANAFLEX ) 4 MG tablet Take 1 tablet (4 mg total) by mouth every 6 (six) hours as needed for muscle spasms. 30 tablet 0   triamcinolone  (KENALOG ) 0.025 % cream Apply 1 Application topically 2 (two)  times daily. X5 days as needed for chelitis 30 g 0   No current facility-administered medications for this visit.     Past Surgical History:  Procedure Laterality Date   BREAST BIOPSY Left 07/30/2021   FIBROADENOMATOID NODULE   TUBAL LIGATION  1998     Allergies  Allergen Reactions   Sulfa Antibiotics Hives      Family History  Problem Relation Age of Onset   Breast cancer Mother    Cancer Mother 63       breast   Hypothyroidism Mother    Heart murmur Mother    Coronary artery disease Mother    Diabetes Daughter    Heart murmur Daughter    Bipolar disorder Daughter    Autism Daughter    Allergic Disorder Daughter    Asthma Daughter    Allergic Disorder Daughter    Breast cancer Maternal Grandmother    Dementia Maternal Grandmother    Atrial fibrillation Maternal Grandmother    Thyroid  disease Maternal Grandmother    Heart disease Maternal Grandfather    Heart attack Maternal Grandfather      Social History Lori Koch reports that she quit smoking about 30 years ago. Her smoking use included cigarettes. She started smoking about 40 years ago. She has a 5 pack-year  smoking history. She has never used smokeless tobacco. Lori Koch reports no history of alcohol use.      Physical Examination Today's Vitals   02/04/24 1309  BP: 124/77  Pulse: 66  SpO2: 100%  Weight: 146 lb (66.2 kg)  Height: 5' 3 (1.6 m)   Body mass index is 25.86 kg/m.  Gen: resting comfortably, no acute distress HEENT: no scleral icterus, pupils equal round and reactive, no palptable cervical adenopathy,  CV: RRR, no m/rg, no jvd Resp: Clear to auscultation bilaterally GI: abdomen is soft, non-tender, non-distended, normal bowel sounds, no hepatosplenomegaly MSK: extremities are warm, no edema.  Skin: warm, no rash Neuro:  no focal deficits Psych: appropriate affect    Assessment and Plan  1.Coronary atherosclerosis - elevated coronary calcium  score - in absence of  symptoms would not plan for ischemic testing - continue risk factor modification. She is on statin with LDL well controlled. Would add ASA 81 mg daily - EKG today shows NSR, no ischemic changes   F/u 1 year   Lori Koch, M.D.

## 2024-02-04 NOTE — Patient Instructions (Signed)
Medication Instructions:  Begin Aspirin '81mg'$  daily  Continue all other medications.     Labwork: none  Testing/Procedures: none  Follow-Up: Your physician wants you to follow up in:  1 year.  You should receive a recall letter in the mail about 2 months prior to the time you are due.  If you don't receive this, please call our office to schedule your follow up appointment.      Any Other Special Instructions Will Be Listed Below (If Applicable).   If you need a refill on your cardiac medications before your next appointment, please call your pharmacy.

## 2024-02-06 ENCOUNTER — Other Ambulatory Visit: Payer: Self-pay | Admitting: "Endocrinology

## 2024-02-09 ENCOUNTER — Encounter: Payer: Self-pay | Admitting: "Endocrinology

## 2024-02-09 ENCOUNTER — Ambulatory Visit (INDEPENDENT_AMBULATORY_CARE_PROVIDER_SITE_OTHER): Payer: Self-pay | Admitting: "Endocrinology

## 2024-02-09 VITALS — BP 102/76 | HR 64 | Ht 63.0 in | Wt 144.2 lb

## 2024-02-09 DIAGNOSIS — E039 Hypothyroidism, unspecified: Secondary | ICD-10-CM

## 2024-02-09 DIAGNOSIS — E785 Hyperlipidemia, unspecified: Secondary | ICD-10-CM

## 2024-02-09 NOTE — Progress Notes (Signed)
 02/09/2024        Endocrinology follow-up note   Subjective:    Patient ID: Lori Koch, female    DOB: [birth year], PCP Jolinda Norene HERO, DO   Past Medical History:  Diagnosis Date   Diabetes mellitus without complication (HCC) 2019   diet only   Thyroid  disease    hypothyroidism   Past Surgical History:  Procedure Laterality Date   BREAST BIOPSY Left 07/30/2021   FIBROADENOMATOID NODULE   TUBAL LIGATION  1998   Social History   Socioeconomic History   Marital status: Married    Spouse name: Not on file   Number of children: 3   Years of education: Not on file   Highest education level: GED or equivalent  Occupational History   Not on file  Tobacco Use   Smoking status: Former    Current packs/day: 0.00    Average packs/day: 0.5 packs/day for 10.0 years (5.0 ttl pk-yrs)    Types: Cigarettes    Start date: 56    Quit date: 1995    Years since quitting: 30.6   Smokeless tobacco: Never  Vaping Use   Vaping status: Never Used  Substance and Sexual Activity   Alcohol use: No   Drug use: No   Sexual activity: Yes    Birth control/protection: None  Other Topics Concern   Not on file  Social History Narrative   Not on file   Social Drivers of Health   Financial Resource Strain: Not on file  Food Insecurity: No Food Insecurity (07/26/2021)   Hunger Vital Sign    Worried About Running Out of Food in the Last Year: Never true    Ran Out of Food in the Last Year: Never true  Transportation Needs: No Transportation Needs (07/26/2021)   PRAPARE - Administrator, Civil Service (Medical): No    Lack of Transportation (Non-Medical): No  Physical Activity: Not on file  Stress: Not on file  Social Connections: Unknown (10/29/2021)   Received from [facility]   Social Network    Social Network: Not on file   Outpatient Encounter Medications as of 02/09/2024  Medication Sig   aspirin  EC 81 MG tablet Take 1 tablet  (81 mg total) by mouth daily. Swallow whole.   busPIRone  (BUSPAR ) 10 MG tablet Take 0.5-1 tablets (5-10 mg total) by mouth 3 (three) times daily. For anxiety   citalopram  (CELEXA ) 40 MG tablet Take 1 tablet (40 mg total) by mouth daily.   Cyanocobalamin (VITAMIN B 12) 500 MCG TABS Take 500 mcg by mouth daily with supper.   etanercept (ENBREL SURECLICK) 50 MG/ML injection as directed Subcutaneous weekly for 90 days   etanercept (ENBREL) 50 MG/ML injection Inject 50 mg into the skin once a week.   leflunomide (ARAVA) 20 MG tablet Take 20 mg by mouth daily.   levothyroxine  (SYNTHROID ) 25 MCG tablet TAKE 1.5 TABLETS (37.5 MCG TOTAL) BY MOUTH DAILY BEFORE BREAKFAST.   predniSONE  (DELTASONE ) 10 MG tablet Take 1 tablet (10 mg total) by mouth daily with breakfast.   rosuvastatin  (CRESTOR ) 10 MG tablet Take 1 tablet (10 mg total) by mouth daily.   tiZANidine  (ZANAFLEX ) 4 MG tablet Take 1 tablet (4 mg total) by mouth every 6 (six) hours as needed for muscle spasms.   triamcinolone  (KENALOG ) 0.025 % cream Apply 1 Application topically 2 (two) times daily. X5 days as needed for chelitis   No facility-administered encounter medications  on file as of 02/09/2024.   ALLERGIES: Allergies  Allergen Reactions   Sulfa Antibiotics Hives    VACCINATION STATUS: Immunization History  Administered Date(s) Administered   Influenza Inj Mdck Quad Pf 06/04/2018   Influenza,inj,Quad PF,6+ Mos 06/04/2017, 04/19/2019, 03/26/2022   Influenza-Unspecified 06/04/2018   PFIZER Comirnaty(Gray Top)Covid-19 Tri-Sucrose Vaccine 08/02/2020   PPD Test 03/22/2019, 03/11/2022, 02/19/2023    HPI ZERINA Koch is 48 y.o. female who presents today with a medical history as above. she is being seen in follow-up for hypothyroidism, multinodular goiter.  -She is currently on levothyroxine  37.5 mcg p.o. daily before breakfast.   She continues to feel better, reports compliance to her medication.   - She says she was diagnosed with  hypothyroidism in 2011.  - She is also known to have nodular goiter, previsit thyroid  ultrasound is significant for similar findings compared to prior studies.  Previous  thyroid  ultrasound shows overall decrease in size of bilateral thyroid  lobes and decrease in size of thyroid  nodule in the right lobe to 1.5 cm from 1.7 cm.  This nodule had no suspicious features.  Her previsit thyroid  ultrasound documents similar findings.    -She has no new complaints.  She has made significant change in her diet, takes her statin and presents with significant improvement in her lipid panel.  She has diet controlled type 2 diabetes with a reported A1c of 4.5%. -She denies palpitations, hot/cold intolerance. She denies dysphagia, dysphonia, nor shortness of breath.    -She denies exposure to neck radiation. - She is a former smoker. - She has 3 grown kids status post tubal ligation in 1998.  Review of Systems Limited as above.  Objective:    BP 102/76   Pulse 64   Ht 5' 3 (1.6 m)   Wt 144 lb 3.2 oz (65.4 kg)   BMI 25.54 kg/m   Wt Readings from Last 3 Encounters:  02/09/24 144 lb 3.2 oz (65.4 kg)  02/04/24 146 lb (66.2 kg)  01/12/24 145 lb 9.6 oz (66 kg)    Physical Exam    Physical Exam- Limited  Constitutional:  Body mass index is 25.54 kg/m. , not in acute distress, normal state of mind   TSH from 03/10/2017 was 1.85 Recent Results (from the past 2160 hours)  TSH     Status: None   Collection Time: 01/09/24  2:22 PM  Result Value Ref Range   TSH 1.930 0.450 - 4.500 uIU/mL  T4, free     Status: None   Collection Time: 01/09/24  2:22 PM  Result Value Ref Range   Free T4 1.26 0.82 - 1.77 ng/dL  Lipid panel     Status: None   Collection Time: 01/09/24  2:22 PM  Result Value Ref Range   Cholesterol, Total 123 100 - 199 mg/dL   Triglycerides 73 0 - 149 mg/dL   HDL 54 >60 mg/dL   VLDL Cholesterol Cal 15 5 - 40 mg/dL   LDL Chol Calc (NIH) 54 0 - 99 mg/dL   Chol/HDL Ratio 2.3 0.0  - 4.4 ratio    Comment:                                   T. Chol/HDL Ratio  Men  Women                               1/2 Avg.Risk  3.4    3.3                                   Avg.Risk  5.0    4.4                                2X Avg.Risk  9.6    7.1                                3X Avg.Risk 23.4   11.0     Surveillance thyroid  ultrasound from December 31, 2022 IMPRESSION: 1. Multinodular thyroid  gland. 2. Nodule labeled 1 in the mid right thyroid  lobe (1.6 cm TR 3) now demonstrates greater than 5 year stability, and can be considered benign. No further dedicated imaging follow-up is indicated. 3. Additional small nodules in the thyroid  gland do not meet criteria for further dedicated follow-up or biopsy as detail.  Assessment & Plan:   1. Hypothyroidism 2. Thyroid  nodule- 3.  Vitamin B12 deficiency -Her previsit thyroid  function tests are consistent with appropriate replacement.  She is advised to continue levothyroxine  37.5 mcg p.o. daily before breakfast.    - We discussed about the correct intake of her thyroid  hormone, on empty stomach at fasting, with water, separated by at least 30 minutes from breakfast and other medications,  and separated by more than 4 hours from calcium , iron, multivitamins, acid reflux medications (PPIs). -Patient is made aware of the fact that thyroid  hormone replacement is needed for life, dose to be adjusted by periodic monitoring of thyroid  function tests.  Her thyroid  imaging studies are reassuring and complete.  She will not need any further thyroid  imaging  Her previsit labs show normal cortisol, favorable CMP, improved lipid panel, and vitamin B12 deficiency.  She is advised to start vitamin B12 500 mcg p.o. daily from over-the-counter.     - I advised patient to maintain close follow up with Jolinda Norene HERO, DO for primary care needs.   I spent  22  minutes in the care of the patient today  including review of labs from Thyroid  Function, CMP, and other relevant labs ; imaging/biopsy records (current and previous including abstractions from other facilities); face-to-face time discussing  her lab results and symptoms, medications doses, her options of short and long term treatment based on the latest standards of care / guidelines;   and documenting the encounter.  Lori Koch  participated in the discussions, expressed understanding, and voiced agreement with the above plans.  All questions were answered to her satisfaction. she is encouraged to contact clinic should she have any questions or concerns prior to her return visit.    Follow up plan: Return in about 6 months (around 08/11/2024) for F/U with Pre-visit Labs.  Ranny Earl, MD [facility] Phone: [phone]  Fax: [phone]   02/09/2024, 6:36 PM This note was partially dictated with voice recognition software. Similar sounding words can be transcribed inadequately or may not  be corrected upon review.

## 2024-02-19 ENCOUNTER — Encounter: Payer: Self-pay | Admitting: Family Medicine

## 2024-03-01 ENCOUNTER — Ambulatory Visit: Admitting: "Endocrinology

## 2024-03-03 ENCOUNTER — Telehealth (INDEPENDENT_AMBULATORY_CARE_PROVIDER_SITE_OTHER): Payer: Self-pay | Admitting: Family Medicine

## 2024-03-03 ENCOUNTER — Encounter: Payer: Self-pay | Admitting: Family Medicine

## 2024-03-03 DIAGNOSIS — N912 Amenorrhea, unspecified: Secondary | ICD-10-CM

## 2024-03-03 DIAGNOSIS — R140 Abdominal distension (gaseous): Secondary | ICD-10-CM

## 2024-03-03 DIAGNOSIS — N941 Unspecified dyspareunia: Secondary | ICD-10-CM

## 2024-03-03 DIAGNOSIS — R109 Unspecified abdominal pain: Secondary | ICD-10-CM

## 2024-03-03 MED ORDER — NAPROXEN SODIUM 550 MG PO TABS: 550.0000 mg | ORAL_TABLET | Freq: Two times a day (BID) | ORAL | 0 refills | Status: AC | PRN

## 2024-03-03 NOTE — Progress Notes (Signed)
 MyChart Video visit  Subjective: Lori Koch PCP: Jolinda Norene HERO, DO YEP:Lori Koch is a 48 y.o. female. Patient provides verbal consent for consult held via video.  Due to COVID-19 pandemic this visit was conducted virtually. This visit type was conducted due to national recommendations for restrictions regarding the COVID-19 Pandemic (e.g. social distancing, sheltering in place) in an effort to limit this patient's exposure and mitigate transmission in our community. All issues noted in this document were discussed and addressed.  A physical exam was not performed with this format.   Location of patient: home Location of provider: [facility] Others present for call: none  1.  Adhesive Koch Patient reports that she has not had a intra cycle in over 2 months now.  She is sexually active with last sexual activity being about a week ago.  She has BTL in place for contraception.  She reports quite significant low back pain and uterine pain.  Denies any abnormal vaginal discharge, dysuria, hematuria.  She does experience dyspareunia but feels like this may be related to vaginal dryness.  She reports a recent change in manufacturer of her thyroid  medication and had thyroid  levels collected very shortly after that change but not in the last couple of months.  She reports abdominal bloating almost like she has PMS.  She feels like she is retaining water.   ROS: Per HPI  Allergies  Allergen Reactions   Sulfa Antibiotics Hives   Past Medical History:  Diagnosis Date   Diabetes mellitus without complication (HCC) 2019   diet only   Thyroid  disease    hypothyroidism    Current Outpatient Medications:    aspirin  EC 81 MG tablet, Take 1 tablet (81 mg total) by mouth daily. Swallow whole., Disp: , Rfl:    busPIRone  (BUSPAR ) 10 MG tablet, Take 0.5-1 tablets (5-10 mg total) by mouth 3 (three) times daily. For anxiety, Disp: 270 tablet, Rfl: 3   citalopram  (CELEXA ) 40 MG tablet, Take 1  tablet (40 mg total) by mouth daily., Disp: 90 tablet, Rfl: 3   Cyanocobalamin (VITAMIN B 12) 500 MCG TABS, Take 500 mcg by mouth daily with supper., Disp: , Rfl:    etanercept (ENBREL SURECLICK) 50 MG/ML injection, as directed Subcutaneous weekly for 90 days, Disp: , Rfl:    etanercept (ENBREL) 50 MG/ML injection, Inject 50 mg into the skin once a week., Disp: , Rfl:    leflunomide (ARAVA) 20 MG tablet, Take 20 mg by mouth daily., Disp: , Rfl:    levothyroxine  (SYNTHROID ) 25 MCG tablet, TAKE 1.5 TABLETS (37.5 MCG TOTAL) BY MOUTH DAILY BEFORE BREAKFAST., Disp: 135 tablet, Rfl: 0   predniSONE  (DELTASONE ) 10 MG tablet, Take 1 tablet (10 mg total) by mouth daily with breakfast., Disp: 10 tablet, Rfl: 0   rosuvastatin  (CRESTOR ) 10 MG tablet, Take 1 tablet (10 mg total) by mouth daily., Disp: 90 tablet, Rfl: 3   tiZANidine  (ZANAFLEX ) 4 MG tablet, Take 1 tablet (4 mg total) by mouth every 6 (six) hours as needed for muscle spasms., Disp: 30 tablet, Rfl: 0   triamcinolone  (KENALOG ) 0.025 % cream, Apply 1 Application topically 2 (two) times daily. X5 days as needed for chelitis, Disp: 30 g, Rfl: 0  Gen: nontoxic appearing female Psych: anxious appearing when talking about her pain  Assessment/ Plan: 48 y.o. female   Absent Koch - Plan: TSH + free T4, US  Pelvic Complete With Transvaginal, Beta hCG quant (ref lab)  Abdominal bloating with cramps - Plan: US  Pelvic  Complete With Transvaginal, naproxen  sodium (ANAPROX  DS) 550 MG tablet  Dyspareunia in female - Plan: US  Pelvic Complete With Transvaginal  Uncertain etiology.  Perhaps perimenopausal but having pain.  Discussed that thyroid  could impact, particularly given recent manufacturer change.  Labs scheduled for Fri and included hcg even though has BTL in place just for completion sake. Would not want to miss tubal pregnancy.  US  of uterus also ordered.  Naprosyn  BID prn to replace Motrin .  Start time: 3:29pm End time: 3:39pm  Total time spent  on patient care (including video visit/ documentation): 12 minutes  Aida Lemaire CHRISTELLA Fielding, DO [facility] [phone]

## 2024-03-04 ENCOUNTER — Encounter: Payer: Self-pay | Admitting: Family Medicine

## 2024-03-05 ENCOUNTER — Other Ambulatory Visit: Payer: Self-pay

## 2024-03-09 ENCOUNTER — Telehealth: Payer: Self-pay | Admitting: Physician Assistant

## 2024-03-09 DIAGNOSIS — R3989 Other symptoms and signs involving the genitourinary system: Secondary | ICD-10-CM

## 2024-03-09 MED ORDER — CEPHALEXIN 500 MG PO CAPS: 500.0000 mg | ORAL_CAPSULE | Freq: Two times a day (BID) | ORAL | 0 refills | Status: AC

## 2024-03-09 NOTE — Progress Notes (Signed)
 I have spent 5 minutes in review of e-visit questionnaire, review and updating patient chart, medical decision making and response to patient.   Elsie Velma Lunger, PA-C

## 2024-03-09 NOTE — Progress Notes (Signed)

## 2024-03-10 ENCOUNTER — Other Ambulatory Visit (HOSPITAL_COMMUNITY): Payer: Self-pay

## 2024-03-15 ENCOUNTER — Ambulatory Visit (HOSPITAL_COMMUNITY)
Admission: RE | Admit: 2024-03-15 | Discharge: 2024-03-15 | Disposition: A | Discharge status: Home / Self Care | Payer: Self-pay | Source: Ambulatory Visit | Attending: Family Medicine | Admitting: Family Medicine

## 2024-03-15 DIAGNOSIS — N888 Other specified noninflammatory disorders of cervix uteri: Secondary | ICD-10-CM | POA: Diagnosis not present

## 2024-03-15 DIAGNOSIS — R109 Unspecified abdominal pain: Secondary | ICD-10-CM | POA: Diagnosis present

## 2024-03-15 DIAGNOSIS — N912 Amenorrhea, unspecified: Secondary | ICD-10-CM | POA: Insufficient documentation

## 2024-03-15 DIAGNOSIS — R140 Abdominal distension (gaseous): Secondary | ICD-10-CM | POA: Diagnosis present

## 2024-03-15 DIAGNOSIS — N941 Unspecified dyspareunia: Secondary | ICD-10-CM | POA: Diagnosis present

## 2024-03-15 DIAGNOSIS — D252 Subserosal leiomyoma of uterus: Secondary | ICD-10-CM | POA: Diagnosis not present

## 2024-03-15 DIAGNOSIS — R9389 Abnormal findings on diagnostic imaging of other specified body structures: Secondary | ICD-10-CM | POA: Diagnosis not present

## 2024-03-16 ENCOUNTER — Ambulatory Visit: Payer: Self-pay | Admitting: Family Medicine

## 2024-03-16 DIAGNOSIS — D252 Subserosal leiomyoma of uterus: Secondary | ICD-10-CM

## 2024-04-05 ENCOUNTER — Other Ambulatory Visit: Payer: Self-pay | Admitting: Medical Genetics

## 2024-04-05 DIAGNOSIS — Z006 Encounter for examination for normal comparison and control in clinical research program: Secondary | ICD-10-CM

## 2024-04-06 ENCOUNTER — Encounter: Payer: Self-pay | Admitting: Obstetrics & Gynecology

## 2024-04-06 ENCOUNTER — Ambulatory Visit: Payer: Self-pay | Admitting: Obstetrics & Gynecology

## 2024-04-06 VITALS — BP 112/69 | HR 68 | Ht 63.0 in | Wt 150.0 lb

## 2024-04-06 DIAGNOSIS — N951 Menopausal and female climacteric states: Secondary | ICD-10-CM

## 2024-04-06 DIAGNOSIS — Z1331 Encounter for screening for depression: Secondary | ICD-10-CM

## 2024-04-06 DIAGNOSIS — D219 Benign neoplasm of connective and other soft tissue, unspecified: Secondary | ICD-10-CM

## 2024-04-06 DIAGNOSIS — R102 Pelvic and perineal pain unspecified side: Secondary | ICD-10-CM

## 2024-04-06 MED ORDER — NORETHINDRONE ACETATE 5 MG PO TABS: 5.0000 mg | ORAL_TABLET | Freq: Every day | ORAL | 3 refills | Status: AC

## 2024-04-06 NOTE — Progress Notes (Signed)
 Patient presents to discuss concern of fibroids. Pt c/o cramping and heavy vaginal bleeding. LMP: 03/04/2024.

## 2024-04-06 NOTE — Progress Notes (Signed)
 Chief Complaint  Patient presents with   Fibroids    Had u/s 9/29      48 y.o. No obstetric history on file. Patient's last menstrual period was 03/04/2024 (exact date). The current method of family planning is tubal ligation.  Outpatient Encounter Medications as of 04/06/2024  Medication Sig   aspirin  EC 81 MG tablet Take 1 tablet (81 mg total) by mouth daily. Swallow whole.   busPIRone  (BUSPAR ) 10 MG tablet Take 0.5-1 tablets (5-10 mg total) by mouth 3 (three) times daily. For anxiety   citalopram  (CELEXA ) 40 MG tablet Take 1 tablet (40 mg total) by mouth daily.   Cyanocobalamin (VITAMIN B 12) 500 MCG TABS Take 500 mcg by mouth daily with supper.   etanercept (ENBREL SURECLICK) 50 MG/ML injection as directed Subcutaneous weekly for 90 days   etanercept (ENBREL) 50 MG/ML injection Inject 50 mg into the skin once a week.   leflunomide (ARAVA) 20 MG tablet Take 20 mg by mouth daily.   levothyroxine  (SYNTHROID ) 25 MCG tablet TAKE 1.5 TABLETS (37.5 MCG TOTAL) BY MOUTH DAILY BEFORE BREAKFAST.   naproxen  sodium (ANAPROX  DS) 550 MG tablet Take 1 tablet (550 mg total) by mouth 2 (two) times daily as needed for moderate pain (pain score 4-6).   norethindrone (AYGESTIN) 5 MG tablet Take 1 tablet (5 mg total) by mouth daily.   rosuvastatin  (CRESTOR ) 10 MG tablet Take 1 tablet (10 mg total) by mouth daily.   tiZANidine  (ZANAFLEX ) 4 MG tablet Take 1 tablet (4 mg total) by mouth every 6 (six) hours as needed for muscle spasms.   triamcinolone  (KENALOG ) 0.025 % cream Apply 1 Application topically 2 (two) times daily. X5 days as needed for chelitis   No facility-administered encounter medications on file as of 04/06/2024.    Subjective Pt was sent here for sonogram showing fibroids, her uterus is 66 cc and there is a 1.9 cm fibroid, singular, which is clinically insignificant and not the cause of her pain or any other issue  She does have pelvic cramping not necessarily associayted with  menses   Past Medical History:  Diagnosis Date   Diabetes mellitus without complication (HCC) 2019   diet only   Thyroid  disease    hypothyroidism    Past Surgical History:  Procedure Laterality Date   BREAST BIOPSY Left 07/30/2021   FIBROADENOMATOID NODULE   TUBAL LIGATION  1998    OB History   No obstetric history on file.     Allergies  Allergen Reactions   Sulfa Antibiotics Hives    Social History   Socioeconomic History   Marital status: Married    Spouse name: Not on file   Number of children: 3   Years of education: Not on file   Highest education level: GED or equivalent  Occupational History   Not on file  Tobacco Use   Smoking status: Former    Current packs/day: 0.00    Average packs/day: 0.5 packs/day for 10.0 years (5.0 ttl pk-yrs)    Types: Cigarettes    Start date: 66    Quit date: 64    Years since quitting: 30.8   Smokeless tobacco: Never  Vaping Use   Vaping status: Never Used  Substance and Sexual Activity   Alcohol use: No   Drug use: No   Sexual activity: Yes    Birth control/protection: None  Other Topics Concern   Not on file  Social History Narrative   Not on  file   Social Drivers of Health   Financial Resource Strain: Low Risk  (04/06/2024)   Overall Financial Resource Strain (CARDIA)    Difficulty of Paying Living Expenses: Not hard at all  Food Insecurity: No Food Insecurity (04/06/2024)   Hunger Vital Sign    Worried About Running Out of Food in the Last Year: Never true    Ran Out of Food in the Last Year: Never true  Transportation Needs: No Transportation Needs (04/06/2024)   PRAPARE - Administrator, Civil Service (Medical): No    Lack of Transportation (Non-Medical): No  Physical Activity: Sufficiently Active (04/06/2024)   Exercise Vital Sign    Days of Exercise per Week: 5 days    Minutes of Exercise per Session: 40 min  Stress: Stress Concern Present (04/06/2024)   [employer] of  Occupational Health - Occupational Stress Questionnaire    Feeling of Stress: Very much  Social Connections: Moderately Integrated (04/06/2024)   Social Connection and Isolation Panel    Frequency of Communication with Friends and Family: More than three times a week    Frequency of Social Gatherings with Friends and Family: Three times a week    Attends Religious Services: 1 to 4 times per year    Active Member of Clubs or Organizations: No    Attends Banker Meetings: Never    Marital Status: Married    Family History  Problem Relation Age of Onset   Breast cancer Mother    Cancer Mother 44       breast   Hypothyroidism Mother    Heart murmur Mother    Coronary artery disease Mother    Diabetes Daughter    Heart murmur Daughter    Bipolar disorder Daughter    Autism Daughter    Allergic Disorder Daughter    Asthma Daughter    Allergic Disorder Daughter    Breast cancer Maternal Grandmother    Dementia Maternal Grandmother    Atrial fibrillation Maternal Grandmother    Thyroid  disease Maternal Grandmother    Heart disease Maternal Grandfather    Heart attack Maternal Grandfather     Medications:       Current Outpatient Medications:    aspirin  EC 81 MG tablet, Take 1 tablet (81 mg total) by mouth daily. Swallow whole., Disp: , Rfl:    busPIRone  (BUSPAR ) 10 MG tablet, Take 0.5-1 tablets (5-10 mg total) by mouth 3 (three) times daily. For anxiety, Disp: 270 tablet, Rfl: 3   citalopram  (CELEXA ) 40 MG tablet, Take 1 tablet (40 mg total) by mouth daily., Disp: 90 tablet, Rfl: 3   Cyanocobalamin (VITAMIN B 12) 500 MCG TABS, Take 500 mcg by mouth daily with supper., Disp: , Rfl:    etanercept (ENBREL SURECLICK) 50 MG/ML injection, as directed Subcutaneous weekly for 90 days, Disp: , Rfl:    etanercept (ENBREL) 50 MG/ML injection, Inject 50 mg into the skin once a week., Disp: , Rfl:    leflunomide (ARAVA) 20 MG tablet, Take 20 mg by mouth daily., Disp: , Rfl:     levothyroxine  (SYNTHROID ) 25 MCG tablet, TAKE 1.5 TABLETS (37.5 MCG TOTAL) BY MOUTH DAILY BEFORE BREAKFAST., Disp: 135 tablet, Rfl: 0   naproxen  sodium (ANAPROX  DS) 550 MG tablet, Take 1 tablet (550 mg total) by mouth 2 (two) times daily as needed for moderate pain (pain score 4-6)., Disp: 60 tablet, Rfl: 0   norethindrone (AYGESTIN) 5 MG tablet, Take 1 tablet (5 mg total) by  mouth daily., Disp: 30 tablet, Rfl: 3   rosuvastatin  (CRESTOR ) 10 MG tablet, Take 1 tablet (10 mg total) by mouth daily., Disp: 90 tablet, Rfl: 3   tiZANidine  (ZANAFLEX ) 4 MG tablet, Take 1 tablet (4 mg total) by mouth every 6 (six) hours as needed for muscle spasms., Disp: 30 tablet, Rfl: 0   triamcinolone  (KENALOG ) 0.025 % cream, Apply 1 Application topically 2 (two) times daily. X5 days as needed for chelitis, Disp: 30 g, Rfl: 0  Objective Blood pressure 112/69, pulse 68, height 5' 3 (1.6 m), weight 150 lb (68 kg), last menstrual period 03/04/2024.  General [facility] female NAD Vulva:  normal appearing vulva with no masses, tenderness or lesions Vagina:  normal mucosa, no discharge Cervix:  Normal no lesions Uterus:  normal size, contour, position, consistency, mobility, non-tender Adnexa: ovaries:present,  normal adnexa in size, nontender and no masses   Pertinent ROS No burning with urination, frequency or urgency No nausea, vomiting or diarrhea Nor fever chills or other constitutional symptoms   Labs or studies Reviewed her pelvic sonogram  Narrative & Impression  CLINICAL DATA:  Initial evaluation for absent menses, abdominal pain.   EXAM: TRANSABDOMINAL AND TRANSVAGINAL ULTRASOUND OF PELVIS   TECHNIQUE: Both transabdominal and transvaginal ultrasound examinations of the pelvis were performed. Transabdominal technique was performed for global imaging of the pelvis including uterus, ovaries, adnexal regions, and pelvic cul-de-sac. It was necessary to proceed with endovaginal exam following the  transabdominal exam to visualize the endometrium.   COMPARISON:  None Available.   FINDINGS: Uterus   Measurements: 7.4 x 3.8 x 4.5 cm = volume: 65.8 mL. Uterus is anteverted. 1.5 x 1.9 x 1.1 cm subserosal fibroid present at the left posterior uterine fundus. Few small nabothian cysts noted about the cervix.   Endometrium   Thickness: 1.8 mm. Trace simple anechoic fluid noted within the endometrial cavity. No other focal abnormality or abnormal vascularity.   Right ovary   Measurements: 2.1 x 1.4 x 1.8 cm = volume: 2.8 mL. Normal appearance/no adnexal mass.   Left ovary   Not visualized.  No adnexal mass.   Other findings   No significant abnormal free fluid.   IMPRESSION: 1. 1.9 cm subserosal fibroid at the left posterior uterine fundus. 2. Endometrial stripe is thin measuring 1.8 mm in thickness with trace simple fluid within the endometrial cavity. 3. Normal right ovary, with nonvisualization of the left ovary. No adnexal mass or free fluid.     Electronically Signed   By: Morene Hoard M.D.   On: 03/16/2024 05:03        Impression + Management Plan: Diagnoses this Encounter::   ICD-10-CM   1. Perimenopausal  N95.1     2. Pelvic cramping  R10.20     3. Fibroids: small clinically insignificant  D21.9         Medications prescribed during  this encounter: Meds ordered this encounter  Medications   norethindrone (AYGESTIN) 5 MG tablet    Sig: Take 1 tablet (5 mg total) by mouth daily.    Dispense:  30 tablet    Refill:  3    Labs or Scans Ordered during this encounter: No orders of the defined types were placed in this encounter.     Follow up Return in about 3 months (around 07/07/2024) for Follow up, with Dr Jayne.

## 2024-04-14 ENCOUNTER — Encounter: Payer: Self-pay | Admitting: Family Medicine

## 2024-04-14 ENCOUNTER — Ambulatory Visit: Admitting: Nurse Practitioner

## 2024-04-14 ENCOUNTER — Ambulatory Visit (INDEPENDENT_AMBULATORY_CARE_PROVIDER_SITE_OTHER)

## 2024-04-14 ENCOUNTER — Encounter: Payer: Self-pay | Admitting: Nurse Practitioner

## 2024-04-14 VITALS — BP 116/76 | HR 71 | Temp 98.1°F | Ht 63.0 in | Wt 154.4 lb

## 2024-04-14 DIAGNOSIS — M5441 Lumbago with sciatica, right side: Secondary | ICD-10-CM

## 2024-04-14 DIAGNOSIS — M1991 Primary osteoarthritis, unspecified site: Secondary | ICD-10-CM | POA: Insufficient documentation

## 2024-04-14 DIAGNOSIS — M549 Dorsalgia, unspecified: Secondary | ICD-10-CM | POA: Diagnosis not present

## 2024-04-14 DIAGNOSIS — M438X6 Other specified deforming dorsopathies, lumbar region: Secondary | ICD-10-CM | POA: Diagnosis not present

## 2024-04-14 DIAGNOSIS — M47816 Spondylosis without myelopathy or radiculopathy, lumbar region: Secondary | ICD-10-CM | POA: Diagnosis not present

## 2024-04-14 DIAGNOSIS — M0579 Rheumatoid arthritis with rheumatoid factor of multiple sites without organ or systems involvement: Secondary | ICD-10-CM | POA: Diagnosis not present

## 2024-04-14 DIAGNOSIS — G8929 Other chronic pain: Secondary | ICD-10-CM | POA: Insufficient documentation

## 2024-04-14 DIAGNOSIS — M48061 Spinal stenosis, lumbar region without neurogenic claudication: Secondary | ICD-10-CM | POA: Diagnosis not present

## 2024-04-14 DIAGNOSIS — M5416 Radiculopathy, lumbar region: Secondary | ICD-10-CM | POA: Diagnosis not present

## 2024-04-14 MED ORDER — PREDNISONE 10 MG PO TABS: 10.0000 mg | ORAL_TABLET | Freq: Every day | ORAL | 0 refills | Status: DC

## 2024-04-14 MED ORDER — METHYLPREDNISOLONE ACETATE 40 MG/ML IJ SUSP
40.0000 mg | Freq: Once | INTRAMUSCULAR | Status: AC
  Administered 2024-04-14: 40 mg via INTRAMUSCULAR

## 2024-04-14 MED ORDER — DICLOFENAC SODIUM 1 % EX GEL: 2.0000 g | Freq: Four times a day (QID) | CUTANEOUS | 0 refills | Status: AC

## 2024-04-14 MED ORDER — METHOCARBAMOL 500 MG PO TABS: 500.0000 mg | ORAL_TABLET | Freq: Three times a day (TID) | ORAL | 0 refills | Status: AC | PRN

## 2024-04-14 NOTE — Telephone Encounter (Signed)
 Looks like this would be a great DOD visit. Please offer her an appt today with DOD

## 2024-04-14 NOTE — Telephone Encounter (Signed)
 I called and spoke with patient and got her scheduled to see DOD this morning.

## 2024-04-14 NOTE — Progress Notes (Signed)
 Subjective:  Patient ID: Lori Koch, female    DOB: Jul 05, 1975, 48 y.o.   MRN: [id number]  Patient Care Team: Jolinda Norene HERO, DO as PCP - General (Family Medicine) Alvan Dorn FALCON, MD as PCP - Cardiology (Cardiology)   Chief Complaint:  Back Pain (Lower back pain that radiates to ankle started yesterday )   HPI: Lori Koch is a 48 y.o. female presenting on 04/14/2024 for Back Pain (Lower back pain that radiates to ankle started yesterday )   Discussed the use of AI scribe software for clinical note transcription with the patient, who gave verbal consent to proceed.  History of Present Illness Lori Koch is a 48 year old female with a history of herniated disc and arthritis who presents with worsening back pain radiating to the right leg.  She has been experiencing severe back pain that has worsened since yesterday. The pain is sharp, stabbing, and intense, rated as a 9 out of 10 on the pain scale, and radiates from her lower back down her right leg. She has a history of herniated discs and arthritis in her lower back, which she has managed for many years, but notes that this current pain is different and more severe than her usual arthritis pain.  She has tried various treatments at home, including Advil , naproxen , ice, and heat, but none have provided relief. She also tried Zanaflex , a muscle relaxer, but it only made her sleepy without alleviating the pain. She has not found relief with muscle relaxers in the past either.  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 Her job involves caring for patients, including one who is wheelchair-bound, which exacerbates her pain. Sitting, standing, and even lying down at night are painful, and driving for extended periods worsens her condition.  Her last menstrual cycle was on the 22nd of last month, and she denies any possibility of pregnancy. She reports  difficulty sleeping due to the pain, stating that she was up all night last night.    LMP 03/08/2024   Relevant past medical, surgical, family, and social history reviewed and updated as indicated.  Allergies and medications reviewed and updated. Data reviewed: Chart in Epic.   Past Medical History:  Diagnosis Date   Diabetes mellitus without complication (HCC) 2019   diet only   Thyroid  disease    hypothyroidism    Past Surgical History:  Procedure Laterality Date   BREAST BIOPSY Left 07/30/2021   FIBROADENOMATOID NODULE   TUBAL LIGATION  1998    Social History   Socioeconomic History   Marital status: Married    Spouse name: Not on file   Number of children: 3   Years of education: Not on file   Highest education level: GED or equivalent  Occupational History   Not on file  Tobacco Use   Smoking status: Former    Current packs/day: 0.00    Average packs/day: 0.5 packs/day for 10.0 years (5.0 ttl pk-yrs)    Types: Cigarettes    Start date: 106    Quit date: 56    Years since quitting: 30.8   Smokeless tobacco: Never  Vaping Use   Vaping status: Never Used  Substance and Sexual Activity   Alcohol use: No   Drug use: No   Sexual activity: Yes    Birth control/protection: None  Other Topics Concern   Not on file  Social  History Narrative   Not on file   Social Drivers of Health   Financial Resource Strain: Low Risk  (04/06/2024)   Overall Financial Resource Strain (CARDIA)    Difficulty of Paying Living Expenses: Not hard at all  Food Insecurity: No Food Insecurity (04/06/2024)   Hunger Vital Sign    Worried About Running Out of Food in the Last Year: Never true    Ran Out of Food in the Last Year: Never true  Transportation Needs: No Transportation Needs (04/06/2024)   PRAPARE - Administrator, Civil Service (Medical): No    Lack of Transportation (Non-Medical): No  Physical Activity: Sufficiently Active (04/06/2024)   Exercise Vital  Sign    Days of Exercise per Week: 5 days    Minutes of Exercise per Session: 40 min  Stress: Stress Concern Present (04/06/2024)   [employer] of Occupational Health - Occupational Stress Questionnaire    Feeling of Stress: Very much  Social Connections: Moderately Integrated (04/06/2024)   Social Connection and Isolation Panel    Frequency of Communication with Friends and Family: More than three times a week    Frequency of Social Gatherings with Friends and Family: Three times a week    Attends Religious Services: 1 to 4 times per year    Active Member of Clubs or Organizations: No    Attends Banker Meetings: Never    Marital Status: Married  Catering Manager Violence: Unknown (09/20/2021)   Received from [facility]   HITS    Physically Hurt: Not on file    Insult or Talk Down To: Not on file    Threaten Physical Harm: Not on file    Scream or Curse: Not on file    Outpatient Encounter Medications as of 04/14/2024  Medication Sig   aspirin  EC 81 MG tablet Take 1 tablet (81 mg total) by mouth daily. Swallow whole.   busPIRone  (BUSPAR ) 10 MG tablet Take 0.5-1 tablets (5-10 mg total) by mouth 3 (three) times daily. For anxiety   citalopram  (CELEXA ) 40 MG tablet Take 1 tablet (40 mg total) by mouth daily.   Cyanocobalamin (VITAMIN B 12) 500 MCG TABS Take 500 mcg by mouth daily with supper.   diclofenac Sodium (VOLTAREN) 1 % GEL Apply 2 g topically 4 (four) times daily.   etanercept (ENBREL SURECLICK) 50 MG/ML injection as directed Subcutaneous weekly for 90 days   etanercept (ENBREL) 50 MG/ML injection Inject 50 mg into the skin once a week.   leflunomide (ARAVA) 20 MG tablet Take 20 mg by mouth daily.   levothyroxine  (SYNTHROID ) 25 MCG tablet TAKE 1.5 TABLETS (37.5 MCG TOTAL) BY MOUTH DAILY BEFORE BREAKFAST.   methocarbamol (ROBAXIN) 500 MG tablet Take 1 tablet (500 mg total) by mouth every 8 (eight) hours as needed for muscle spasms.   naproxen  sodium  (ANAPROX  DS) 550 MG tablet Take 1 tablet (550 mg total) by mouth 2 (two) times daily as needed for moderate pain (pain score 4-6).   norethindrone (AYGESTIN) 5 MG tablet Take 1 tablet (5 mg total) by mouth daily.   predniSONE  (DELTASONE ) 10 MG tablet Take 1 tablet (10 mg total) by mouth daily with breakfast.   rosuvastatin  (CRESTOR ) 10 MG tablet Take 1 tablet (10 mg total) by mouth daily.   triamcinolone  (KENALOG ) 0.025 % cream Apply 1 Application topically 2 (two) times daily. X5 days as needed for chelitis   [DISCONTINUED] tiZANidine  (ZANAFLEX ) 4 MG tablet Take 1 tablet (4 mg total)  by mouth every 6 (six) hours as needed for muscle spasms.   Facility-Administered Encounter Medications as of 04/14/2024  Medication   methylPREDNISolone acetate (DEPO-MEDROL) injection 40 mg    Allergies  Allergen Reactions   Sulfa Antibiotics Hives    Pertinent ROS per HPI, otherwise unremarkable      Objective:  BP 116/76   Pulse 71   Temp 98.1 F (36.7 C) (Temporal)   Ht 5' 3 (1.6 m)   Wt 154 lb 6.4 oz (70 kg)   LMP 03/04/2024 (Exact Date)   SpO2 100%   BMI 27.35 kg/m    Wt Readings from Last 3 Encounters:  04/14/24 154 lb 6.4 oz (70 kg)  04/06/24 150 lb (68 kg)  02/09/24 144 lb 3.2 oz (65.4 kg)    Physical Exam Vitals and nursing note reviewed.  Constitutional:      General: She is not in acute distress. HENT:     Head: Normocephalic and atraumatic.     Nose: Nose normal.  Eyes:     Extraocular Movements: Extraocular movements intact.     Conjunctiva/sclera: Conjunctivae normal.     Pupils: Pupils are equal, round, and reactive to light.  Cardiovascular:     Heart sounds: Normal heart sounds.  Pulmonary:     Effort: Pulmonary effort is normal.     Breath sounds: Normal breath sounds.  Musculoskeletal:     Cervical back: Normal.     Thoracic back: Normal.     Lumbar back: Tenderness present. Positive right straight leg raise test.  Skin:    General: Skin is warm and dry.      Findings: No rash.  Neurological:     Mental Status: She is alert and oriented to person, place, and time.  Psychiatric:        Mood and Affect: Mood normal.        Behavior: Behavior normal.        Thought Content: Thought content normal.        Judgment: Judgment normal.    Physical Exam MUSCULOSKELETAL: Pain on palpation of lumbar spine.     Results for orders placed or performed in visit on 08/28/23  TSH   Collection Time: 01/09/24  2:22 PM  Result Value Ref Range   TSH 1.930 0.450 - 4.500 uIU/mL  T4, free   Collection Time: 01/09/24  2:22 PM  Result Value Ref Range   Free T4 1.26 0.82 - 1.77 ng/dL  Lipid panel   Collection Time: 01/09/24  2:22 PM  Result Value Ref Range   Cholesterol, Total 123 100 - 199 mg/dL   Triglycerides 73 0 - 149 mg/dL   HDL 54 >60 mg/dL   VLDL Cholesterol Cal 15 5 - 40 mg/dL   LDL Chol Calc (NIH) 54 0 - 99 mg/dL   Chol/HDL Ratio 2.3 0.0 - 4.4 ratio       Pertinent labs & imaging results that were available during my care of the patient were reviewed by me and considered in my medical decision making.  Assessment & Plan:  Tityana was seen today for back pain.  Diagnoses and all orders for this visit:  Chronic midline low back pain with right-sided sciatica -     DG Lumbar Spine 2-3 Views -     methylPREDNISolone acetate (DEPO-MEDROL) injection 40 mg -     diclofenac Sodium (VOLTAREN) 1 % GEL; Apply 2 g topically 4 (four) times daily. -     predniSONE  (DELTASONE ) 10 MG  tablet; Take 1 tablet (10 mg total) by mouth daily with breakfast.  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     Assessment and Plan Roselind is a 48 year old Caucasian female seen today for back pain, no acute distress Assessment &  Plan Acute right-sided lumbar radiculopathy with sciatica and lumbar spine osteoarthritis Acute exacerbation of chronic low back pain with right-sided sciatica, likely due to lumbar radiculopathy and underlying lumbar spine osteoarthritis. Previous treatments with NSAIDs and muscle relaxants have been ineffective. Order lumbar spine x-ray to assess for new injury or changes. - Administer steroid injection today. - Prescribe prednisone  1 tablet twice a day for 5 days, starting tomorrow. - Order lumbar spine x-ray to assess for new injury or changes. - Prescribe Robaxin (methocarbamol) as needed for muscle relaxation. - Prescribe Voltaren gel for topical application up to four times a day for arthritis pain. - Advise rest and consider taking 3 days off work, returning on Sunday. - heat pad q15 mins as tolerated      Continue all other maintenance medications.  Follow up plan: Return if symptoms worsen or fail to improve.   Continue healthy lifestyle choices, including diet (rich in fruits, vegetables, and lean proteins, and low in salt and simple carbohydrates) and exercise (at least 30 minutes of moderate physical activity daily).  Educational handout given for    Clinical References  Chronic Back Pain Chronic back pain is back pain that lasts longer than 3 months. The pain may get worse at certain times (flare-ups). There are things you can do at home to manage your pain. Follow these instructions at home: Watch for any changes in your symptoms. Take these actions to help with your pain: Managing pain and stiffness     If told, put ice on the painful area. You may be told to use ice for 24-48 hours after a flare-up starts. Put ice in a plastic bag. Place a towel between your skin and the bag. Leave the ice on for 20 minutes, 2-3 times a day. If told, put heat on the painful area. Do this as often as told by your doctor. Use the heat source that your doctor recommends, such as a  moist heat pack or a heating pad. Place a towel between your skin and the heat source. Leave the heat on for 20-30 minutes. If your skin turns bright red, take off the ice or heat right away to prevent skin damage. The risk of damage is higher if you cannot feel pain, heat, or cold. Soak in a warm bath. This can help with pain. Activity        Avoid bending and other activities that make the pain worse. When you stand: Keep your upper back and neck straight. Keep your shoulders pulled back. Avoid slouching. When you sit: Keep your back straight. Relax your shoulders. Do not round your shoulders or pull them backward. Do not sit or stand in one place for too long. Take short rest breaks during the day. Lying down or standing is often better than sitting. Resting can help relieve pain. When sitting or lying down for a long time, do some mild activity or stretching. This will help to prevent stiffness and pain. Get regular exercise. Ask your doctor what  activities are safe for you. You may have to avoid lifting. Ask your provider how much you can safely lift. If you lift things: Bend your knees. Keep the weight close to your body. Avoid twisting. Medicines Take over-the-counter and prescription medicines only as told by your doctor. You may need to take medicines for pain and swelling. These may be taken by mouth or put on the skin. You may also be given muscle relaxants. Ask your doctor if the medicine prescribed to you: Requires you to avoid driving or using machinery. Can cause trouble pooping (constipation). You may need to take these actions to prevent or treat trouble pooping: Drink enough fluid to keep your pee (urine) pale yellow. Take over-the-counter or prescription medicines. Eat foods that are high in fiber. These include beans, whole grains, and fresh fruits and vegetables. Limit foods that are high in fat and sugars. These include fried or sweet foods. General  instructions  Sleep on a firm mattress. Try lying on your side with your knees slightly bent. If you lie on your back, put a pillow under your knees. Do not smoke or use any products that contain nicotine or tobacco. If you need help quitting, ask your doctor. Contact a doctor if: Your pain does not get better with rest or medicine. You have new pain. You have a fever. You lose weight quickly. You have trouble doing your normal activities. One or both of your legs or feet feel weak. One or both of your legs or feet lose feeling (have numbness). Get help right away if: You are not able to control when you pee or poop. You have bad back pain and: You feel like you may vomit (nauseous). You vomit. You have pain in your chest or your belly (abdomen). You have shortness of breath. You faint. These symptoms may be an emergency. Get help right away. Call 911. Do not wait to see if the symptoms will go away. Do not drive yourself to the hospital. This information is not intended to replace advice given to you by your health care provider. Make sure you discuss any questions you have with your health care provider. Document Revised: 01/21/2022 Document Reviewed: 01/21/2022 Elsevier Patient Education  2024 Else[employer] Inc. Managing Pain Without Opioids Opioids are strong medicines used to treat moderate to severe pain. For some people, especially those who have long-term (chronic) pain, opioids may not be the best choice for pain management due to: Side effects like nausea, constipation, and sleepiness. The risk of addiction (opioid use disorder). The longer you take opioids, the greater your risk of addiction. Pain that lasts for more than 3 months is called chronic pain. Managing chronic pain usually requires more than one approach and is often provided by a team of health care providers working together (multidisciplinary approach). Pain management may be done at a pain management center or pain  clinic. How to manage pain without the use of opioids Use non-opioid medicines Non-opioid medicines for pain may include: Over-the-counter or prescription non-steroidal anti-inflammatory drugs (NSAIDs). These may be the first medicines used for pain. They work well for muscle and bone pain, and they reduce swelling. Acetaminophen. This over-the-counter medicine may work well for milder pain but not swelling. Antidepressants. These may be used to treat chronic pain. A certain type of antidepressant (tricyclics) is often used. These medicines are given in lower doses for pain than when used for depression. Anticonvulsants. These are usually used to treat seizures but may also reduce nerve (  neuropathic) pain. Muscle relaxants. These relieve pain caused by sudden muscle tightening (spasms). You may also use a pain medicine that is applied to the skin as a patch, cream, or gel (topical analgesic), such as a numbing medicine. These may cause fewer side effects than medicines taken by mouth. Do certain therapies as directed Some therapies can help with pain management. They include: Physical therapy. You will do exercises to gain strength and flexibility. A physical therapist may teach you exercises to move and stretch parts of your body that are weak, stiff, or painful. You can learn these exercises at physical therapy visits and practice them at home. Physical therapy may also involve: Massage. Heat wraps or applying heat or cold to affected areas. Electrical signals that interrupt pain signals (transcutaneous electrical nerve stimulation, TENS). Weak lasers that reduce pain and swelling (low-level laser therapy). Signals from your body that help you learn to regulate pain (biofeedback). Occupational therapy. This helps you to learn ways to function at home and work with less pain. Recreational therapy. This involves trying new activities or hobbies, such as a physical activity or drawing. Mental  health therapy, including: Cognitive behavioral therapy (CBT). This helps you learn coping skills for dealing with pain. Acceptance and commitment therapy (ACT) to change the way you think and react to pain. Relaxation therapies, including muscle relaxation exercises and mindfulness-based stress reduction. Pain management counseling. This may be individual, family, or group counseling.   Receive medical treatments Medical treatments for pain management include: Nerve block injections. These may include a pain blocker and anti-inflammatory medicines. You may have injections: Near the spine to relieve chronic back or neck pain. Into joints to relieve back or joint pain. Into nerve areas that supply a painful area to relieve body pain. Into muscles (trigger point injections) to relieve some painful muscle conditions. A medical device placed near your spine to help block pain signals and relieve nerve pain or chronic back pain (spinal cord stimulation device). Acupuncture. Follow these instructions at home Medicines Take over-the-counter and prescription medicines only as told by your health care provider. If you are taking pain medicine, ask your health care providers about possible side effects to watch out for. Do not drive or use heavy machinery while taking prescription opioid pain medicine. Lifestyle  Do not use drugs or alcohol to reduce pain. If you drink alcohol, limit how much you have to: 0-1 drink a day for women who are not pregnant. 0-2 drinks a day for men. Know how much alcohol is in a drink. In the [place] one drink equals one 12 oz bottle of beer (355 mL), one 5 oz glass of wine (148 mL), or one 1 oz glass of hard liquor (44 mL). Do not use any products that contain nicotine or tobacco. These products include cigarettes, chewing tobacco, and vaping devices, such as e-cigarettes. If you need help quitting, ask your health care provider. Eat a healthy diet and maintain a healthy  weight. Poor diet and excess weight may make pain worse. Eat foods that are high in fiber. These include fresh fruits and vegetables, whole grains, and beans. Limit foods that are high in fat and processed sugars, such as fried and sweet foods. Exercise regularly. Exercise lowers stress and may help relieve pain. Ask your health care provider what activities and exercises are safe for you. If your health care provider approves, join an exercise class that combines movement and stress reduction. Examples include yoga and tai chi. Get enough sleep.  Lack of sleep may make pain worse. Lower stress as much as possible. Practice stress reduction techniques as told by your therapist. General instructions Work with all your pain management providers to find the treatments that work best for you. You are an important member of your pain management team. There are many things you can do to reduce pain on your own. Consider joining an online or in-person support group for people who have chronic pain. Keep all follow-up visits. This is important. Where to find more information You can find more information about managing pain without opioids from: American Academy of Pain Medicine: painmed.org Institute for Chronic Pain: instituteforchronicpain.org American Chronic Pain Association: theacpa.org Contact a health care provider if: You have side effects from pain medicine. Your pain gets worse or does not get better with treatments or home therapy. You are struggling with anxiety or depression. Summary Many types of pain can be managed without opioids. Chronic pain may respond better to pain management without opioids. Pain is best managed when you and a team of health care providers work together. Pain management without opioids may include non-opioid medicines, medical treatments, physical therapy, mental health therapy, and lifestyle changes. Tell your health care providers if your pain gets worse or  is not being managed well enough. This information is not intended to replace advice given to you by your health care provider. Make sure you discuss any questions you have with your health care provider. Document Revised: 09/13/2020 Document Reviewed: 09/13/2020 Elsevier Patient Education  2024 Elsevier Inc. Sciatica  Sciatica is pain, weakness, tingling, or loss of feeling (numbness) along the sciatic nerve. The sciatic nerve starts in the lower back and goes down the back of each leg. Sciatica usually affects one side of the body. Sciatica usually goes away on its own or with treatment. Sometimes, sciatica may come back. What are the causes? This condition happens when the sciatic nerve is pinched or has pressure put on it. This may be caused by: A disk in between the bones of the spine bulging out too far (herniated disk). Changes in the spinal disks due to aging. A condition that affects a muscle in the butt. Extra bone growth near the sciatic nerve. A break (fracture) of the area between your hip bones (pelvis). Pregnancy. Tumor. This is rare. What increases the risk? You are more likely to develop this condition if you: Play sports that put pressure or stress on the spine. Have poor strength and ease of movement (flexibility). Have had a back injury or back surgery. Sit for long periods of time. Do activities that involve bending or lifting over and over again. Are very overweight (obese). What are the signs or symptoms? Symptoms can vary from mild to very bad. They may include: Any of these problems in the lower back, leg, hip, or butt: Mild tingling, loss of feeling, or dull aches. A burning feeling. Sharp pains. Loss of feeling in the back of the calf or the sole of the foot. Leg weakness. Very bad back pain that makes it hard to move. These symptoms may get worse when you cough, sneeze, or laugh. They may also get worse when you sit or stand for long periods of  time. How is this treated? This condition often gets better without any treatment. However, treatment may include: Changing or cutting back on physical activity when you have pain. Exercising, including strengthening and stretching. Putting ice or heat on the affected area. Shots of medicines to relieve pain  and swelling or to relax your muscles. Surgery. Follow these instructions at home: Medicines Take over-the-counter and prescription medicines only as told by your doctor. Ask your doctor if you should avoid driving or using machines while you are taking your medicine. Managing pain     If told, put ice on the affected area. To do this: Put ice in a plastic bag. Place a towel between your skin and the bag. Leave the ice on for 20 minutes, 2-3 times a day. If your skin turns bright red, take off the ice right away to prevent skin damage. The risk of skin damage is higher if you cannot feel pain, heat, or cold. If told, put heat on the affected area. Do this as often as told by your doctor. Use the heat source that your doctor tells you to use, such as a moist heat pack or a heating pad. Place a towel between your skin and the heat source. Leave the heat on for 20-30 minutes. If your skin turns bright red, take off the heat right away to prevent burns. The risk of burns is higher if you cannot feel pain, heat, or cold. Activity  Return to your normal activities when your doctor says that it is safe. Avoid activities that make your symptoms worse. Take short rests during the day. When you rest for a long time, do some physical activity or stretching between periods of rest. Avoid sitting for a long time without moving. Get up and move around at least one time each hour. Do exercises and stretches as told by your doctor. Do not lift anything that is heavier than 10 lb (4.5 kg). Avoid lifting heavy things even when you do not have symptoms. Avoid lifting heavy things over and  over. When you lift objects, always lift in a way that is safe for your body. To do this, you should: Bend your knees. Keep the object close to your body. Avoid twisting. General instructions Stay at a healthy weight. Wear comfortable shoes that support your feet. Avoid wearing high heels. Avoid sleeping on a mattress that is too soft or too hard. You might have less pain if you sleep on a mattress that is firm enough to support your back. Contact a doctor if: Your pain is not controlled by medicine. Your pain does not get better. Your pain gets worse. Your pain lasts longer than 4 weeks. You lose weight without trying. Get help right away if: You cannot control when you pee (urinate) or poop (have a bowel movement). You have weakness in any of these areas and it gets worse: Lower back. The area between your hip bones. Butt. Legs. You have redness or swelling of your back. You have a burning feeling when you pee. Summary Sciatica is pain, weakness, tingling, or loss of feeling (numbness) along the sciatic nerve. This may include the lower back, legs, hips, and butt. This condition happens when the sciatic nerve is pinched or has pressure put on it. Treatment often includes rest, exercise, medicines, and putting ice or heat on the affected area. This information is not intended to replace advice given to you by your health care provider. Make sure you discuss any questions you have with your health care provider. Document Revised: 09/10/2021 Document Reviewed: 09/10/2021 Elsevier Patient Education  2024 [employer]  The above assessment and management plan was discussed with the patient. The patient verbalized understanding of and has agreed to the management plan. Patient is aware to call  the clinic if they develop any new symptoms or if symptoms persist or worsen. Patient is aware when to return to the clinic for a follow-up visit. Patient educated on when it is appropriate to go  to the emergency department.   Amiayah Giebel [place] Thompson, DNP [facility] [address] [place], [place] [address] [phone]

## 2024-04-16 ENCOUNTER — Ambulatory Visit: Payer: Self-pay | Admitting: Family Medicine

## 2024-04-19 DIAGNOSIS — M51362 Other intervertebral disc degeneration, lumbar region with discogenic back pain and lower extremity pain: Secondary | ICD-10-CM

## 2024-04-19 NOTE — Telephone Encounter (Signed)
 MyChart message sent to patient. LS

## 2024-04-23 NOTE — Telephone Encounter (Signed)
 done

## 2024-05-04 LAB — GENECONNECT MOLECULAR SCREEN: Genetic Analysis Overall Interpretation: NEGATIVE

## 2024-05-06 ENCOUNTER — Other Ambulatory Visit: Payer: Self-pay | Admitting: "Endocrinology

## 2024-05-17 ENCOUNTER — Encounter: Payer: Self-pay | Admitting: Family Medicine

## 2024-05-18 ENCOUNTER — Other Ambulatory Visit (HOSPITAL_COMMUNITY): Payer: Self-pay | Admitting: Family Medicine

## 2024-05-18 DIAGNOSIS — Z1231 Encounter for screening mammogram for malignant neoplasm of breast: Secondary | ICD-10-CM

## 2024-05-31 ENCOUNTER — Inpatient Hospital Stay (HOSPITAL_COMMUNITY): Admission: RE | Admit: 2024-05-31 | Discharge: 2024-05-31 | Attending: Family Medicine

## 2024-05-31 ENCOUNTER — Encounter (HOSPITAL_COMMUNITY): Payer: Self-pay

## 2024-05-31 DIAGNOSIS — Z1231 Encounter for screening mammogram for malignant neoplasm of breast: Secondary | ICD-10-CM

## 2024-06-11 ENCOUNTER — Telehealth: Admitting: Family Medicine

## 2024-06-11 DIAGNOSIS — R399 Unspecified symptoms and signs involving the genitourinary system: Secondary | ICD-10-CM | POA: Diagnosis not present

## 2024-06-11 MED ORDER — CEPHALEXIN 500 MG PO CAPS: 500.0000 mg | ORAL_CAPSULE | Freq: Two times a day (BID) | ORAL | 0 refills | Status: AC

## 2024-06-11 NOTE — Progress Notes (Signed)

## 2024-07-05 ENCOUNTER — Encounter: Payer: Self-pay | Admitting: Nurse Practitioner

## 2024-07-05 ENCOUNTER — Ambulatory Visit: Admitting: Nurse Practitioner

## 2024-07-05 VITALS — BP 134/82 | HR 82 | Temp 97.6°F | Ht 63.0 in | Wt 150.0 lb

## 2024-07-05 DIAGNOSIS — M069 Rheumatoid arthritis, unspecified: Secondary | ICD-10-CM | POA: Diagnosis not present

## 2024-07-05 MED ORDER — PREDNISONE 20 MG PO TABS: ORAL_TABLET | ORAL | 0 refills | Status: AC

## 2024-07-05 MED ORDER — METHYLPREDNISOLONE ACETATE 80 MG/ML IJ SUSP
80.0000 mg | Freq: Once | INTRAMUSCULAR | Status: AC
  Administered 2024-07-05: 80 mg via INTRAMUSCULAR

## 2024-07-05 NOTE — Progress Notes (Addendum)
" ° °  Subjective:    Patient ID: Lori Koch, female    DOB: 01-Dec-1975, 49 y.o.   MRN: [id number]   Chief Complaint: bilateral wrists swelling   HPI  Patient in c/o bil wrist swelling. She has dx of rheumatoid arthritis and she see rheumatology every 6 months. Is currently on enbrel injections weekly. Today her wrist are inflammed. Hurts to straighten fingers bil. Any movement of her hands hurts.  Patient Active Problem List   Diagnosis Date Noted   Chronic midline low back pain with right-sided sciatica 04/14/2024   Lumbar radiculopathy 04/14/2024   Rheumatoid arthritis with rheumatoid factor of multiple sites without organ or systems involvement (HCC) 04/14/2024   Primary osteoarthritis 04/14/2024   Acute midline low back pain with right-sided sciatica 01/12/2024   Plantar fasciitis of right foot 01/12/2024   Vitamin B deficiency 01/28/2023   Family history of heart disease 10/28/2022   GAD (generalized anxiety disorder) 10/28/2022   Panic attack 10/28/2022   Fatigue 07/09/2022   Multinodular goiter 06/26/2021   Hypothyroidism 03/10/2017   Thyroid  nodule 03/10/2017   History of herniated intervertebral disc 03/10/2017        Review of Systems  Musculoskeletal:  Positive for arthralgias (bil wrists).       Objective:   Physical Exam Constitutional:      Appearance: Normal appearance.  Cardiovascular:     Rate and Rhythm: Normal rate and regular rhythm.     Heart sounds: Normal heart sounds.  Pulmonary:     Breath sounds: Normal breath sounds.  Musculoskeletal:     Comments: Bil wrist edema with limited ROM due to pain  Skin:    General: Skin is warm.  Neurological:     General: No focal deficit present.     Mental Status: She is alert and oriented to person, place, and time.  Psychiatric:        Mood and Affect: Mood normal.        Behavior: Behavior normal.    BP 134/82   Pulse 82   Temp 97.6 F (36.4 C) (Temporal)   Ht 5' 3 (1.6 m)   Wt 150 lb  (68 kg)   SpO2 100%   BMI 26.57 kg/m         Assessment & Plan:   Lori Koch in today with chief complaint of bilateral wrists swelling   1. Rheumatoid arthritis flare (HCC) (Primary) Soak in warm epsom salt Keep follow up with rheumatology Dont start oral steroids until tomorrow - methylPREDNISolone  acetate (DEPO-MEDROL ) injection 80 mg - predniSONE  (DELTASONE ) 20 MG tablet; 2 po at sametime daily for 5 days-  Dispense: 10 tablet; Refill: 0    The above assessment and management plan was discussed with the patient. The patient verbalized understanding of and has agreed to the management plan. Patient is aware to call the clinic if symptoms persist or worsen. Patient is aware when to return to the clinic for a follow-up visit. Patient educated on when it is appropriate to go to the emergency department.   Lori Gladis, FNP   "

## 2024-07-05 NOTE — Patient Instructions (Signed)
Rheumatoid Arthritis Rheumatoid arthritis (RA) is a long-term (chronic) disease. RA causes inflammation in your joints. Your joints may feel painful, stiff, swollen, and warm. RA may start slowly. It most often affects the small joints of the hands and feet. It can also affect other parts of the body. Symptoms of RA often come and go. There is no cure for RA, but medicines can help your symptoms. What are the causes? RA is an autoimmune disease. This means that your body's defense system (immune system) attacks healthy parts of your body by mistake. The exact cause of RA is not known. What increases the risk? Being female. Having a family history of RA or other diseases like RA. Smoking. Being very overweight (obese). Being exposed to pollutants or chemicals. What are the signs or symptoms? Symptoms start slowly. They are often worse in the morning. The first symptom is often morning stiffness that lasts longer than 30 minutes. As RA gets worse, symptoms may include: Pain, stiffness, swelling, warmth, and tenderness in joints on both sides of your body. Loss of energy. Not wanting to eat as much as normal. Weight loss. A low fever. Dry eyes and a dry mouth. Firm lumps that grow under your skin. Changes in the way your joints look or the way they work. Symptoms vary and they often come and go. Symptoms sometimes get worse for a period of time. These are called flares. How is this treated? Treatment may include: Taking good care of yourself. Be sure to rest as needed, eat a healthy diet, and exercise. Medicines. These may include: Pain relievers. Medicines to help with inflammation. Disease-modifying antirheumatic drugs (DMARDs). Medicines called biologic response modifiers. Physical therapy and occupational therapy. Surgery, if joint damage is very bad. Your doctor will work with you to find the best treatments. Follow these instructions at home: Managing pain, stiffness, and  swelling If told, put heat on the affected area. Do this as often as told by your doctor. Use the heat source that your doctor recommends, such as a moist heat pack or a heating pad. Place a towel between your skin and the heat source. Leave the heat on for 20-30 minutes. Take off the heat if your skin turns bright red. This is very important. If you cannot feel pain, heat, or cold, you have a greater risk of getting burned.  Activity Return to your normal activities when your doctor says that it is safe. Rest when you have a flare. Exercise as told by your doctor. This can help your joints move better and get stronger. General instructions Take over-the-counter and prescription medicines only as told by your doctor. Keep all follow-up visits. Where to find more information [employer]: rheumatology.org Arthritis Foundation: arthritis.org Contact a doctor if: You have a flare. You have a fever. You have problems because of your medicines. Get help right away if: You have chest pain. You have trouble breathing. You get a hot, painful joint all of a sudden, and it is worse than your normal joint aches. These symptoms may be an emergency. Get help right away. Call 911. Do not wait to see if the symptoms will go away. Do not drive yourself to the hospital. Summary RA is a long-term disease. RA causes inflammation in your joints. Symptoms of RA start slowly. They are often worse in the morning. This information is not intended to replace advice given to you by your health care provider. Make sure you discuss any questions you have with  your health care provider. Document Revised: 04/05/2021 Document Reviewed: 04/05/2021 Elsevier Patient Education  2024 [employer].

## 2024-07-15 ENCOUNTER — Encounter: Payer: Self-pay | Admitting: Family Medicine

## 2024-08-11 ENCOUNTER — Ambulatory Visit: Payer: Self-pay | Admitting: "Endocrinology

## 2024-11-15 ENCOUNTER — Encounter: Payer: Self-pay | Admitting: Family Medicine
